# Patient Record
Sex: Female | Born: 1950 | Race: Black or African American | Hispanic: No | Smoking: Never smoker
Health system: Southern US, Community
[De-identification: ages and names within clinical notes are randomized; demographics above are authoritative.]

## PROBLEM LIST (undated history)

## (undated) DIAGNOSIS — I10 Essential (primary) hypertension: Secondary | ICD-10-CM

## (undated) DIAGNOSIS — G629 Polyneuropathy, unspecified: Secondary | ICD-10-CM

## (undated) DIAGNOSIS — M109 Gout, unspecified: Secondary | ICD-10-CM

## (undated) DIAGNOSIS — G43909 Migraine, unspecified, not intractable, without status migrainosus: Secondary | ICD-10-CM

## (undated) DIAGNOSIS — I519 Heart disease, unspecified: Secondary | ICD-10-CM

## (undated) HISTORY — DX: Heart disease, unspecified: I51.9

## (undated) HISTORY — PX: BRAIN SURGERY: SHX531

## (undated) HISTORY — PX: ABDOMINAL HYSTERECTOMY: SHX81

## (undated) HISTORY — DX: Essential (primary) hypertension: I10

## (undated) HISTORY — PX: COLONOSCOPY: SHX174

## (undated) HISTORY — DX: Gout, unspecified: M10.9

## (undated) HISTORY — DX: Polyneuropathy, unspecified: G62.9

## (undated) HISTORY — DX: Migraine, unspecified, not intractable, without status migrainosus: G43.909

---

## 1975-12-14 HISTORY — PX: OTHER SURGICAL HISTORY: SHX169

## 2022-03-24 ENCOUNTER — Other Ambulatory Visit: Payer: Self-pay

## 2022-03-24 ENCOUNTER — Emergency Department: Payer: Medicare Other

## 2022-03-24 ENCOUNTER — Encounter: Payer: Self-pay | Admitting: Emergency Medicine

## 2022-03-24 ENCOUNTER — Emergency Department
Admission: EM | Admit: 2022-03-24 | Discharge: 2022-03-24 | Disposition: A | Discharge status: Home / Self Care | Payer: Medicare Other | Attending: Emergency Medicine | Admitting: Emergency Medicine

## 2022-03-24 DIAGNOSIS — M79604 Pain in right leg: Secondary | ICD-10-CM | POA: Diagnosis not present

## 2022-03-24 DIAGNOSIS — R1010 Upper abdominal pain, unspecified: Secondary | ICD-10-CM | POA: Diagnosis not present

## 2022-03-24 DIAGNOSIS — W182XXA Fall in (into) shower or empty bathtub, initial encounter: Secondary | ICD-10-CM | POA: Insufficient documentation

## 2022-03-24 DIAGNOSIS — Z950 Presence of cardiac pacemaker: Secondary | ICD-10-CM | POA: Diagnosis not present

## 2022-03-24 DIAGNOSIS — S0083XA Contusion of other part of head, initial encounter: Secondary | ICD-10-CM | POA: Insufficient documentation

## 2022-03-24 DIAGNOSIS — R42 Dizziness and giddiness: Secondary | ICD-10-CM

## 2022-03-24 DIAGNOSIS — M25569 Pain in unspecified knee: Secondary | ICD-10-CM | POA: Insufficient documentation

## 2022-03-24 DIAGNOSIS — S0990XA Unspecified injury of head, initial encounter: Secondary | ICD-10-CM | POA: Diagnosis present

## 2022-03-24 LAB — URINALYSIS, ROUTINE W REFLEX MICROSCOPIC
Bacteria, UA: NONE SEEN
Bilirubin Urine: NEGATIVE
Glucose, UA: NEGATIVE mg/dL
Hgb urine dipstick: NEGATIVE
Ketones, ur: NEGATIVE mg/dL
Nitrite: NEGATIVE
Protein, ur: >=300 mg/dL — AB
Specific Gravity, Urine: 1.014 (ref 1.005–1.030)
pH: 6 (ref 5.0–8.0)

## 2022-03-24 LAB — BASIC METABOLIC PANEL
Anion gap: 9 (ref 5–15)
BUN: 23 mg/dL (ref 8–23)
CO2: 23 mmol/L (ref 22–32)
Calcium: 7.8 mg/dL — ABNORMAL LOW (ref 8.9–10.3)
Chloride: 105 mmol/L (ref 98–111)
Creatinine, Ser: 1.72 mg/dL — ABNORMAL HIGH (ref 0.44–1.00)
GFR, Estimated: 32 mL/min — ABNORMAL LOW (ref >60)
Glucose, Bld: 105 mg/dL — ABNORMAL HIGH (ref 70–99)
Potassium: 4.2 mmol/L (ref 3.5–5.1)
Sodium: 137 mmol/L (ref 135–145)

## 2022-03-24 LAB — CBC
HCT: 35.5 % — ABNORMAL LOW (ref 36.0–46.0)
Hemoglobin: 11.3 g/dL — ABNORMAL LOW (ref 12.0–15.0)
MCH: 30.1 pg (ref 26.0–34.0)
MCHC: 31.8 g/dL (ref 30.0–36.0)
MCV: 94.4 fL (ref 80.0–100.0)
Platelets: 203 10*3/uL (ref 150–400)
RBC: 3.76 MIL/uL — ABNORMAL LOW (ref 3.87–5.11)
RDW: 12.9 % (ref 11.5–15.5)
WBC: 9.3 10*3/uL (ref 4.0–10.5)
nRBC: 0 % (ref 0.0–0.2)

## 2022-03-24 LAB — TROPONIN I (HIGH SENSITIVITY)
Troponin I (High Sensitivity): 7 ng/L (ref <18)
Troponin I (High Sensitivity): 9 ng/L (ref <18)

## 2022-03-24 NOTE — ED Provider Notes (Signed)
? ?[facility] ?Provider Note ? ? ? Event Date/Time  ? First MD Initiated Contact with Patient 03/24/22 1548   ?  (approximate) ? ? ?History  ? ?Dizziness and Fall ? ? ?HPI ? ?Valerie Munoz is a 71 y.o. female who reports she took a shower this morning sat in the chair and then got up to get dressed.  She got dizzy and fell over hitting her knee.  She also hit her forehead.  She did not pass out.  She complains of some pain in the area of her forehead where there is a large bruise.  She reports she still dizzy now.  She is not spinning she is not lightheaded just a little off balance. ?  ? ? ?Physical Exam  ? ?Triage Vital Signs: ?ED Triage Vitals  ?Enc Vitals Group  ?   BP 03/24/22 1327 (!) 157/90  ?   Pulse Rate 03/24/22 1327 83  ?   Resp 03/24/22 1327 18  ?   Temp 03/24/22 1327 98 ?F (36.7 ?C)  ?   Temp Source 03/24/22 1327 Oral  ?   SpO2 03/24/22 1327 95 %  ?   Weight 03/24/22 1320 185 lb (83.9 kg)  ?   Height 03/24/22 1320 '5\' 4"'$  (1.626 m)  ?   Head Circumference --   ?   Peak Flow --   ?   Pain Score 03/24/22 1320 10  ?   Pain Loc --   ?   Pain Edu? --   ?   Excl. in [place]? --   ? ? ?Most recent vital signs: ?Vitals:  ? 03/24/22 1750 03/24/22 1938  ?BP: (!) 153/91 (!) 157/86  ?Pulse: 77 80  ?Resp: 16 16  ?Temp:    ?SpO2: 96% 99%  ? ? ? ?General: Awake, no distress.  ?Head normocephalic atraumatic except for a large bruise and swelling over the forehead ?Eyes pupils equal round extraocular movements intact. ?CV:  Good peripheral perfusion.  ?Resp:  Normal effort.  Lungs are clear ?Abd:  No distention.  Soft and nontender ?Extremities with no edema.  Patient has some tenderness of the upper outer quadrant of the patella but there is no bruising there.  Patient has good range of motion of the knee there is no effusion knee is stable patient can walk without difficulty ?Neuro exam cranial nerves II through XII are intact although visual fields were not checked finger-nose heel-to-shin and rapid  alternating movements and the hands are normal patient had some difficulty because of knee pain with the right leg on the left shin.  Romberg was negative with her eyes closed with her hands extended I pushed her and she did not fall over did not even become unsteady ? ? ?ED Results / Procedures / Treatments  ? ?Labs ?(all labs ordered are listed, but only abnormal results are displayed) ?Labs Reviewed  ?BASIC METABOLIC PANEL - Abnormal; Notable for the following components:  ?    Result Value  ? Glucose, Bld 105 (*)   ? Creatinine, Ser 1.72 (*)   ? Calcium 7.8 (*)   ? GFR, Estimated 32 (*)   ? All other components within normal limits  ?CBC - Abnormal; Notable for the following components:  ? RBC 3.76 (*)   ? Hemoglobin 11.3 (*)   ? HCT 35.5 (*)   ? All other components within normal limits  ?URINALYSIS, ROUTINE W REFLEX MICROSCOPIC - Abnormal; Notable for the following components:  ? Color,  Urine YELLOW (*)   ? APPearance HAZY (*)   ? Protein, ur >=300 (*)   ? Leukocytes,Ua MODERATE (*)   ? All other components within normal limits  ?CBG MONITORING, ED  ?TROPONIN I (HIGH SENSITIVITY)  ?TROPONIN I (HIGH SENSITIVITY)  ? ? ? ?EKG ? ? ? ? 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 ?CT of the head read by radiology films reviewed by me only show a hematoma over the forehead. ?CT of the neck does not show any acute disease.  There is a thyroid nodule but I did tell the patient to follow-up with her primary care doctor. ? ?PROCEDURES: ? ?Critical Care performed:  ? ?Procedures ? ? ?MEDICATIONS ORDERED IN ED: ?Medications - No data to display ? ? ?IMPRESSION / MDM / ASSESSMENT AND PLAN / ED COURSE  ?I reviewed the triage vital signs and the nursing notes. ?----------------------------------------- ?6:00 PM on 03/24/2022 ?----------------------------------------- ?Patient has a  [employer] pacemaker in place.  We do not have a [employer]'s pacemaker interrogator here apparently.  We have called the phone number on the patient's card 3-4 times and then we got a doctor who I spoke with who referred me to a cardiologist who I spoke with who referred me to our tech who I spoke with who [place] he will be here in an hour to interrogate the patient's pacemaker.  Patient is feeling well at this point.  Her knee is not really swollen.  It has full range of motion there is 1 little area of tenderness in the upper outer part of the patella but there its not bruised or swollen there. ?Electrolytes CBC urine etc. have all been normal.  Troponin is also negative.  I do not have any old labs to compare to. ?Pacemaker was interrogated by the [employer] technician who came in.  Everything was normal.  Patient's Romberg was normal.  Patient has no palmar drift.  Patient looks good and feels good is able to walk without difficulty.  I will let her go home.  I will have her follow-up with primary care and her cardiologist. ? ?The patient is on the cardiac monitor to evaluate for evidence of arrhythmia and/or significant heart rate changes none were seen. ? ?Patient's pacemaker defibrillator was interrogated by the tech who came and nothing was found the [employer] tech did leave a report. ? ?CT of the neck did show a thyroid nodule.  I will have the patient called in the morning to remind him to follow-up thyroid nodule.  Will need an outpatient ultrasound which primary care can schedule. ? ?FINAL CLINICAL IMPRESSION(S) / ED DIAGNOSES  ? ?Final diagnoses:  ?Dizziness  ? ? ? ?Rx / DC Orders  ? ?ED Discharge Orders   ? ? None  ? ?  ? ? ? ?Note:  This document was prepared using Dragon voice recognition software and may include unintentional dictation errors. ?  ?Nena Polio, MD ?03/25/22 0000 ? ?

## 2022-03-24 NOTE — ED Notes (Signed)
April with [employer] at bedside ?

## 2022-03-24 NOTE — Discharge Instructions (Signed)
Please be careful.  Get up slowly while you are holding onto something.  Everything we have done here today including checking on your pacer defibrillator has been normal.  I will let you go but I do want you to see your primary care doctor in the next couple days and your cardiologist also in the next couple days.  Please do not hesitate to return if you have any further symptoms. ?

## 2022-03-24 NOTE — ED Triage Notes (Addendum)
Pt via POV from home. Pt states after she got out of the shower she got dizzy and fell. Pt hit her forehead on the floor. Pt c/o headache. Denies any LOC. States she does not know if she if on any blood thinners. Pt has a large hematoma to R forehead. Pt is A&OX4 and NAD ?

## 2022-03-24 NOTE — ED Notes (Signed)
Pacemaker interrogated by [employer] ?

## 2022-03-24 NOTE — ED Notes (Signed)
EDP on phone with on call cardiologist from pacemaker company. They will call back. ?

## 2022-03-24 NOTE — ED Notes (Signed)
Pt has [employer] ICD/pacemaker. This ER does not have same type of pacemaker interrogator device. Tried to call company at [phone]. No answer, attempting to wait on hold for operator. ?

## 2022-03-24 NOTE — ED Notes (Signed)
Trying to call back ICD company again. On hold for on call physician.  ?

## 2022-03-25 ENCOUNTER — Telehealth: Payer: Self-pay | Admitting: Emergency Medicine

## 2022-03-25 NOTE — Telephone Encounter (Signed)
Called patient at request of dr Cinda Quest.  I had sent secure chat to pcp that is listed.  Patient says she has called that office, and that PA is gone.  They told her that it may be July before they can see her.  She has called the insurance co and is going to call to find another pcp.  I explained the incidental finding of nodule in thyroid and that she needs to have an ultrasound to determine what it is.  I told her that she should not wait that long and advised she should have within the next month.  I told her that if her new pcp could not see her within the next few weeks, she should call the insurance company to see which urgent or acute care she could go to to get the ultrasound ordered.  She agrees to do this.   ?

## 2022-03-26 ENCOUNTER — Ambulatory Visit: Payer: Self-pay

## 2022-03-26 NOTE — Telephone Encounter (Signed)
? ? ?  Chief Complaint: Seen in ED 03/24/22 with dizziness and fall. Has appointment next week. ?Symptoms: Headache ?Frequency: This week ?Pertinent Negatives: Patient denies  ?Disposition: '[]'$ ED /'[]'$ Urgent Care (no appt availability in office) / '[]'$ Appointment(In office/virtual)/ '[]'$  [facility]/ '[]'$ Home Care/ '[]'$ Refused Recommended Disposition /'[]'$ [facility]/ '[x]'$  Follow-up with PCP ?Additional Notes: Instructed to return to ED for worsening of symptoms.  ? ?Answer Assessment - Initial Assessment Questions ?1. DESCRIPTION: "Describe your dizziness." ?    Dizzy ?2. LIGHTHEADED: "Do you feel lightheaded?" (e.g., somewhat faint, woozy, weak upon standing) ?    Yes ?3. VERTIGO: "Do you feel like either you or the room is spinning or tilting?" (i.e. vertigo) ?    No ?4. SEVERITY: "How bad is it?"  "Do you feel like you are going to faint?" "Can you stand and walk?" ?  - MILD: Feels slightly dizzy, but walking normally. ?  - MODERATE: Feels unsteady when walking, but not falling; interferes with normal activities (e.g., school, work). ?  - SEVERE: Unable to walk without falling, or requires assistance to walk without falling; feels like passing out now.  ?    Mild-moderate ?5. ONSET:  "When did the dizziness begin?" ?    This week ?6. AGGRAVATING FACTORS: "Does anything make it worse?" (e.g., standing, change in head position) ?    Movement ?7. HEART RATE: "Can you tell me your heart rate?" "How many beats in 15 seconds?"  (Note: not all patients can do this)   ?    No ?8. CAUSE: "What do you think is causing the dizziness?" ?    Unsure ?9. RECURRENT SYMPTOM: "Have you had dizziness before?" If Yes, ask: "When was the last time?" "What happened that time?" ?    Yes ?10. OTHER SYMPTOMS: "Do you have any other symptoms?" (e.g., fever, chest pain, vomiting, diarrhea, bleeding) ?      Pain to forehead from her fall ?11. PREGNANCY: "Is there any chance you are pregnant?" "When was your last menstrual  period?" ?      No ? ?Protocols used: Dizziness - Lightheadedness-A-AH ? ?

## 2022-03-30 ENCOUNTER — Ambulatory Visit (INDEPENDENT_AMBULATORY_CARE_PROVIDER_SITE_OTHER): Payer: Medicare Other | Admitting: Internal Medicine

## 2022-03-30 ENCOUNTER — Encounter: Payer: Self-pay | Admitting: Internal Medicine

## 2022-03-30 VITALS — BP 139/82 | HR 84 | Ht 64.0 in | Wt 189.2 lb

## 2022-03-30 DIAGNOSIS — L989 Disorder of the skin and subcutaneous tissue, unspecified: Secondary | ICD-10-CM

## 2022-03-30 DIAGNOSIS — Z95810 Presence of automatic (implantable) cardiac defibrillator: Secondary | ICD-10-CM | POA: Insufficient documentation

## 2022-03-30 DIAGNOSIS — E6609 Other obesity due to excess calories: Secondary | ICD-10-CM | POA: Diagnosis not present

## 2022-03-30 DIAGNOSIS — G8929 Other chronic pain: Secondary | ICD-10-CM | POA: Diagnosis not present

## 2022-03-30 DIAGNOSIS — G629 Polyneuropathy, unspecified: Secondary | ICD-10-CM

## 2022-03-30 DIAGNOSIS — S060XAA Concussion with loss of consciousness status unknown, initial encounter: Secondary | ICD-10-CM | POA: Insufficient documentation

## 2022-03-30 DIAGNOSIS — E041 Nontoxic single thyroid nodule: Secondary | ICD-10-CM

## 2022-03-30 DIAGNOSIS — Z6832 Body mass index (BMI) 32.0-32.9, adult: Secondary | ICD-10-CM

## 2022-03-30 DIAGNOSIS — S060XAD Concussion with loss of consciousness status unknown, subsequent encounter: Secondary | ICD-10-CM

## 2022-03-30 NOTE — Assessment & Plan Note (Signed)
Patient will be referred to neurologist ?

## 2022-03-30 NOTE — Progress Notes (Signed)
? ?New Patient Office Visit ? ?Subjective:  ?Patient ID: Valerie Munoz, female    DOB: Nov 22, 1951  Age: 71 y.o. MRN: [id number] ? ?CC:  ?Chief Complaint  ?Patient presents with  ? New Patient (Initial Visit)  ? ? ?HPI ?Patient presents for head injury  , rt knee injury  and rt thyroid  nodule, h/o falli wk ago, pt walk with  walker ,pt has defib,no h/o ht attack, pt dont smoke or drink ,pt has h/o  chf ? ? ?Patient was seen in the hospital on 03/24/2022 ? ?With a history that after taking shower in the morning she sat in the chair got up and dressed.  At that time she became dizzy.  She fell over and she hit her knee.  She also hit her forehead.  She did not pass out completely.  Her blood pressure was okay in the emergency room.  Patient is known to have a defibrillator which has been started in [place].  Her kidney functions are borderline calcium was borderline low in the hospital.  Hemoglobin was 11.3.  CT scan revealed a hematoma of the forehead small thyroid nodule noted by the CT scan.  Patient pacemaker is [employer] ? ?Patient has pain in the both legs consistent with neuropathy.  She had a brain surgery done and then after that she was started on trileptal 150 mg p.o. daily, she probably had to see neurologist  sometime later  ?at the present time we will try to get her to a pain specialist to manage her pain with neuropathy ? ?Past Medical History:  ?Diagnosis Date  ? Heart disease   ? Hypertension   ? Migraine headache   ? Neuropathy   ? ? ? ?Current Outpatient Medications:  ?  carvedilol (COREG) 25 MG tablet, Take 25 mg by mouth 2 (two) times daily., Disp: , Rfl:  ?  DULoxetine (CYMBALTA) 30 MG capsule, Take 30 mg by mouth daily., Disp: , Rfl:  ?  folic acid (FOLVITE) 1 MG tablet, Take 1 mg by mouth daily., Disp: , Rfl:  ?  furosemide (LASIX) 20 MG tablet, Take 20 mg by mouth every other day., Disp: , Rfl:  ?  gabapentin (NEURONTIN) 300 MG capsule, Take 300 mg by mouth 3 (three) times daily., Disp: ,  Rfl:  ?  losartan (COZAAR) 50 MG tablet, Take 50 mg by mouth daily., Disp: , Rfl:  ?  multivitamin (RENA-VIT) TABS tablet, Take 1 tablet by mouth daily., Disp: , Rfl:  ?  OXcarbazepine (TRILEPTAL) 150 MG tablet, Take 150 mg by mouth daily., Disp: , Rfl:   ? ?Past Surgical History:  ?Procedure Laterality Date  ? ABDOMINAL HYSTERECTOMY    ? eye tumor removal  1977  ? ? ?History reviewed. No pertinent family history. ? ?Social History  ? ?Socioeconomic History  ? Marital status: Widowed  ?  Spouse name: Not on file  ? Number of children: Not on file  ? Years of education: Not on file  ? Highest education level: Not on file  ?Occupational History  ? Not on file  ?Tobacco Use  ? Smoking status: Never  ? Smokeless tobacco: Never  ?Substance and Sexual Activity  ? Alcohol use: Not Currently  ? Drug use: Never  ? Sexual activity: Not Currently  ?Other Topics Concern  ? Not on file  ?Social History Narrative  ? Not on file  ? ?Social Determinants of Health  ? ?Financial Resource Strain: Not on file  ?Food Insecurity: Not  on file  ?Transportation Needs: Not on file  ?Physical Activity: Not on file  ?Stress: Not on file  ?Social Connections: Not on file  ?Intimate Partner Violence: Not on file  ? ? ?ROS ?Review of Systems  ?Constitutional:  Positive for fatigue. Negative for chills and fever.  ?HENT:  Positive for postnasal drip and sneezing.   ?Eyes:  Negative for pain.  ?Respiratory:  Negative for cough, choking, chest tightness and wheezing.   ?Cardiovascular:  Positive for leg swelling. Negative for chest pain.  ?Genitourinary:  Negative for dysuria.  ?Musculoskeletal:  Positive for arthralgias and back pain.  ?Neurological:  Positive for dizziness, weakness and light-headedness.  ?Psychiatric/Behavioral:  Negative for behavioral problems.   ? ?Objective:  ? ?Today's Vitals: BP 139/82   Pulse 84   Ht '5\' 4"'$  (1.626 m)   Wt 189 lb 3.2 oz (85.8 kg)   BMI 32.48 kg/m?  ? ?Physical Exam ?Vitals reviewed.  ?Constitutional:   ?    Appearance: She is obese. She is ill-appearing.  ?HENT:  ?   Head: Normocephalic.  ? ?   Comments: Bruise forehead, blood below both eyes ?Neurological:  ?   Mental Status: She is alert.  ? ? ?Assessment & Plan:  ? ?Problem List Items Addressed This Visit   ? ?  ? Endocrine  ? Thyroid cyst  ?  Patient has nodule or thyroid cyst which was seen on the CT scan ? patient will be referred to the endocrinologist ? ?  ?  ? Relevant Medications  ? carvedilol (COREG) 25 MG tablet  ?  ? Nervous and Auditory  ? Concussion with unknown loss of consciousness status - Primary  ?  Patient can walk independently   ?no focal neurological signs  present  ?she was advised to report any dizziness or passing out spell. ? ?  ?  ? Neuropathy  ?  Patient will be referred to neurologist ? ?  ?  ?  ? Other  ? AICD (automatic cardioverter/defibrillator) present  ?  Patient be referred to EP specialist ? ?  ?  ? Class 1 obesity due to excess calories without serious comorbidity with body mass index (BMI) of 32.0 to 32.9 in adult  ?  - I encouraged the patient to lose weight.  ?- I educated them on making healthy dietary choices including eating more fruits and vegetables and less fried foods. ?- I encouraged the patient to exercise more, and educated on the benefits of exercise including weight loss, diabetes prevention, and hypertension prevention.  ? Dietary counseling with a registered dietician ? Referral to a weight management support group (e.g. Weight Watchers, Overeaters Anonymous) ?? If your BMI is greater than 29 or you have gained more than 15 pounds you should work on weight loss. ?? Attend a healthy cooking class  ? ?  ?  ? ? ?Outpatient Encounter Medications as of 03/30/2022  ?Medication Sig  ? carvedilol (COREG) 25 MG tablet Take 25 mg by mouth 2 (two) times daily.  ? DULoxetine (CYMBALTA) 30 MG capsule Take 30 mg by mouth daily.  ? folic acid (FOLVITE) 1 MG tablet Take 1 mg by mouth daily.  ? furosemide (LASIX) 20 MG tablet  Take 20 mg by mouth every other day.  ? gabapentin (NEURONTIN) 300 MG capsule Take 300 mg by mouth 3 (three) times daily.  ? losartan (COZAAR) 50 MG tablet Take 50 mg by mouth daily.  ? multivitamin (RENA-VIT) TABS tablet Take 1 tablet by  mouth daily.  ? OXcarbazepine (TRILEPTAL) 150 MG tablet Take 150 mg by mouth daily.  ? [DISCONTINUED] enalapril (VASOTEC) 20 MG tablet Take 20 mg by mouth daily.  ? ?No facility-administered encounter medications on file as of 03/30/2022.  ? ? ?Follow-up: No follow-ups on file.  ? ?Cletis Athens, MD ?

## 2022-03-30 NOTE — Assessment & Plan Note (Addendum)
Patient has nodule or thyroid cyst which was seen on the CT scan ? patient will be referred to the endocrinologist ?

## 2022-03-30 NOTE — Assessment & Plan Note (Signed)

## 2022-03-30 NOTE — Assessment & Plan Note (Signed)
Patient be referred to EP specialist ?

## 2022-03-30 NOTE — Assessment & Plan Note (Addendum)
Patient can walk independently   ?no focal neurological signs  present  ?she was advised to report any dizziness or passing out spell. ?

## 2022-04-05 ENCOUNTER — Encounter: Payer: Self-pay | Admitting: Internal Medicine

## 2022-04-05 NOTE — Addendum Note (Signed)
Addended by: Alois Cliche on: 04/05/2022 03:07 PM ? ? Modules accepted: Orders ? ?

## 2022-04-13 ENCOUNTER — Ambulatory Visit: Payer: Medicare Other | Admitting: Internal Medicine

## 2022-04-27 ENCOUNTER — Encounter: Payer: Self-pay | Admitting: Internal Medicine

## 2022-04-27 ENCOUNTER — Ambulatory Visit (INDEPENDENT_AMBULATORY_CARE_PROVIDER_SITE_OTHER): Payer: Medicare Other | Admitting: Internal Medicine

## 2022-04-27 VITALS — BP 140/83 | HR 66 | Ht 64.0 in | Wt 191.4 lb

## 2022-04-27 DIAGNOSIS — L2081 Atopic neurodermatitis: Secondary | ICD-10-CM | POA: Diagnosis not present

## 2022-04-27 DIAGNOSIS — G629 Polyneuropathy, unspecified: Secondary | ICD-10-CM

## 2022-04-27 DIAGNOSIS — Z95810 Presence of automatic (implantable) cardiac defibrillator: Secondary | ICD-10-CM | POA: Diagnosis not present

## 2022-04-27 DIAGNOSIS — E6609 Other obesity due to excess calories: Secondary | ICD-10-CM

## 2022-04-27 DIAGNOSIS — Z6832 Body mass index (BMI) 32.0-32.9, adult: Secondary | ICD-10-CM

## 2022-04-27 DIAGNOSIS — S060XAD Concussion with loss of consciousness status unknown, subsequent encounter: Secondary | ICD-10-CM

## 2022-04-27 MED ORDER — CETIRIZINE HCL 10 MG PO TABS: 10.0000 mg | ORAL_TABLET | Freq: Every day | ORAL | 11 refills | Status: DC

## 2022-04-27 NOTE — Assessment & Plan Note (Signed)
We will try to arrange pacemaker follow-up ?

## 2022-04-27 NOTE — Assessment & Plan Note (Signed)
Neuropathy is a chronic problem she takes gabapentin for that ?

## 2022-04-27 NOTE — Assessment & Plan Note (Signed)

## 2022-04-27 NOTE — Progress Notes (Signed)
? ?Established Patient Office Visit ? ?Subjective:  ?Patient ID: Valerie Munoz, female    DOB: 12/23/1950  Age: 71 y.o. MRN: [id number] ? ?CC:  ?Chief Complaint  ?Patient presents with  ? Follow-up  ?  Patient has no complains today. Patient does currently have a rash but has an appointment with dermatology.  ? ? ?HPI ? ?Valerie Munoz presents for allergy  and congestion in sinuses ? ?Past Medical History:  ?Diagnosis Date  ? Heart disease   ? Hypertension   ? Migraine headache   ? Neuropathy   ? ? ?Past Surgical History:  ?Procedure Laterality Date  ? ABDOMINAL HYSTERECTOMY    ? eye tumor removal  1977  ? ? ?History reviewed. No pertinent family history. ? ?Social History  ? ?Socioeconomic History  ? Marital status: Widowed  ?  Spouse name: Not on file  ? Number of children: Not on file  ? Years of education: Not on file  ? Highest education level: Not on file  ?Occupational History  ? Not on file  ?Tobacco Use  ? Smoking status: Never  ? Smokeless tobacco: Never  ?Substance and Sexual Activity  ? Alcohol use: Not Currently  ? Drug use: Never  ? Sexual activity: Not Currently  ?Other Topics Concern  ? Not on file  ?Social History Narrative  ? Not on file  ? ?Social Determinants of Health  ? ?Financial Resource Strain: Not on file  ?Food Insecurity: Not on file  ?Transportation Needs: Not on file  ?Physical Activity: Not on file  ?Stress: Not on file  ?Social Connections: Not on file  ?Intimate Partner Violence: Not on file  ? ? ? ?Current Outpatient Medications:  ?  carvedilol (COREG) 25 MG tablet, Take 25 mg by mouth 2 (two) times daily., Disp: , Rfl:  ?  DULoxetine (CYMBALTA) 30 MG capsule, Take 30 mg by mouth daily., Disp: , Rfl:  ?  folic acid (FOLVITE) 1 MG tablet, Take 1 mg by mouth daily., Disp: , Rfl:  ?  furosemide (LASIX) 20 MG tablet, Take 20 mg by mouth every other day., Disp: , Rfl:  ?  gabapentin (NEURONTIN) 300 MG capsule, Take 300 mg by mouth 3 (three) times daily., Disp: , Rfl:  ?  losartan  (COZAAR) 50 MG tablet, Take 50 mg by mouth daily., Disp: , Rfl:  ?  multivitamin (RENA-VIT) TABS tablet, Take 1 tablet by mouth daily., Disp: , Rfl:  ?  OXcarbazepine (TRILEPTAL) 150 MG tablet, Take 150 mg by mouth daily., Disp: , Rfl:   ? ?No Known Allergies ? ?ROS ?Review of Systems  ?Constitutional: Negative.   ?HENT: Negative.  Negative for ear discharge and ear pain.   ?Eyes: Negative.   ?Respiratory: Negative.    ?Cardiovascular: Negative.  Negative for chest pain, palpitations and leg swelling.  ?Gastrointestinal: Negative.  Negative for constipation and nausea.  ?Endocrine: Negative.   ?Genitourinary: Negative.   ?Musculoskeletal: Negative.   ?Skin:  Positive for color change and rash.  ?Allergic/Immunologic: Positive for environmental allergies.  ?Neurological: Negative.   ?Hematological: Negative.   ?Psychiatric/Behavioral: Negative.    ?All other systems reviewed and are negative. ? ?  ?Objective:  ?  ?Physical Exam ?Vitals reviewed.  ?Constitutional:   ?   Appearance: Normal appearance.  ?HENT:  ?   Mouth/Throat:  ?   Mouth: Mucous membranes are moist.  ?Eyes:  ?   Pupils: Pupils are equal, round, and reactive to light.  ?Neck:  ?   Vascular: No  carotid bruit.  ?Cardiovascular:  ?   Rate and Rhythm: Normal rate and regular rhythm.  ?   Pulses: Normal pulses.  ?   Heart sounds: Normal heart sounds.  ?Pulmonary:  ?   Effort: Pulmonary effort is normal.  ?   Breath sounds: Normal breath sounds.  ?Abdominal:  ?   General: Bowel sounds are normal.  ?   Palpations: Abdomen is soft. There is no hepatomegaly, splenomegaly or mass.  ?   Tenderness: There is no abdominal tenderness.  ?   Hernia: No hernia is present.  ?Musculoskeletal:     ?   General: No tenderness.  ?   Cervical back: Neck supple.  ?   Right lower leg: No edema.  ?   Left lower leg: No edema.  ?Skin: ?   Findings: No rash.  ?   Comments: Dermatitis of feet  ?Neurological:  ?   Mental Status: She is alert and oriented to person, place, and time.   ?   Motor: No weakness.  ?Psychiatric:     ?   Mood and Affect: Mood and affect normal.     ?   Behavior: Behavior normal.  ? ? ?BP 140/83   Pulse 66   Ht '5\' 4"'$  (1.626 m)   Wt 191 lb 6.4 oz (86.8 kg)   BMI 32.85 kg/m?  ?Wt Readings from Last 3 Encounters:  ?04/27/22 191 lb 6.4 oz (86.8 kg)  ?03/30/22 189 lb 3.2 oz (85.8 kg)  ?03/24/22 185 lb (83.9 kg)  ? ? ? ?Health Maintenance Due  ?Topic Date Due  ? COVID-19 Vaccine (1) Never done  ? Hepatitis C Screening  Never done  ? TETANUS/TDAP  Never done  ? COLONOSCOPY (Pts 45-75yr Insurance coverage will need to be confirmed)  Never done  ? MAMMOGRAM  Never done  ? Zoster Vaccines- Shingrix (1 of 2) Never done  ? Pneumonia Vaccine 71 Years old (1 - PCV) Never done  ? DEXA SCAN  Never done  ? ? ?There are no preventive care reminders to display for this patient. ? ?No results found for: TSH ?Lab Results  ?Component Value Date  ? WBC 9.3 03/24/2022  ? HGB 11.3 (L) 03/24/2022  ? HCT 35.5 (L) 03/24/2022  ? MCV 94.4 03/24/2022  ? PLT 203 03/24/2022  ? ?Lab Results  ?Component Value Date  ? NA 137 03/24/2022  ? K 4.2 03/24/2022  ? CO2 23 03/24/2022  ? GLUCOSE 105 (H) 03/24/2022  ? BUN 23 03/24/2022  ? CREATININE 1.72 (H) 03/24/2022  ? CALCIUM 7.8 (L) 03/24/2022  ? ANIONGAP 9 03/24/2022  ? ?No results found for: CHOL ?No results found for: HDL ?No results found for: LTallmadge?No results found for: TRIG ?No results found for: CHOLHDL ?No results found for: HGBA1C ? ?  ?Assessment & Plan:  ? ?Problem List Items Addressed This Visit   ? ?  ? Nervous and Auditory  ? Concussion with unknown loss of consciousness status - Primary  ?  Patient doing well she does not have any headache does not have any follow-up passing out spell.  Gait is steady ? ?  ?  ? Neuropathy  ?  Neuropathy is a chronic problem she takes gabapentin for that ? ?  ?  ?  ? Musculoskeletal and Integument  ? Atopic neurodermatitis  ?  Patient has either neurodermatitis or atopic dermatitis of the both feet.  We  will send her to the skin specialist ? ?  ?  ?  ?  Other  ? AICD (automatic cardioverter/defibrillator) present  ?  We will try to arrange pacemaker follow-up ? ?  ?  ? Class 1 obesity due to excess calories without serious comorbidity with body mass index (BMI) of 32.0 to 32.9 in adult  ?  - I encouraged the patient to lose weight.  ?- I educated them on making healthy dietary choices including eating more fruits and vegetables and less fried foods. ?- I encouraged the patient to exercise more, and educated on the benefits of exercise including weight loss, diabetes prevention, and hypertension prevention.  ? Dietary counseling with a registered dietician ? Referral to a weight management support group (e.g. Weight Watchers, Overeaters Anonymous) ?? If your BMI is greater than 29 or you have gained more than 15 pounds you should work on weight loss. ?? Attend a healthy cooking class  ? ?  ?  ?Hypercholesterolemia ? ?I advised the patient to follow Mediterranean diet ?This diet is rich in fruits vegetables and whole grain, and ?This diet is also rich in fish and lean meat ?Patient should also eat a handful of almonds or walnuts daily ?Recent heart study indicated that average follow-up on this kind of diet reduces the cardiovascular mortality by 50 to 70%== ? ?No orders of the defined types were placed in this encounter. ? ? ?Follow-up: No follow-ups on file.  ? ? ?Cletis Athens, MD ?

## 2022-04-27 NOTE — Assessment & Plan Note (Signed)
Patient has either neurodermatitis or atopic dermatitis of the both feet.  We will send her to the skin specialist ?

## 2022-04-27 NOTE — Assessment & Plan Note (Signed)
Patient doing well she does not have any headache does not have any follow-up passing out spell.  Gait is steady ?

## 2022-04-28 NOTE — Addendum Note (Signed)
Addended by: Alois Cliche on: 04/28/2022 09:36 AM ? ? Modules accepted: Orders ? ?

## 2022-04-30 ENCOUNTER — Ambulatory Visit (INDEPENDENT_AMBULATORY_CARE_PROVIDER_SITE_OTHER): Payer: Medicare Other

## 2022-04-30 DIAGNOSIS — Z0000 Encounter for general adult medical examination without abnormal findings: Secondary | ICD-10-CM | POA: Diagnosis not present

## 2022-04-30 MED ORDER — WALL GRAB BAR MISC: 2.0000 | Freq: Every day | 0 refills | Status: DC | PRN

## 2022-04-30 MED ORDER — ADJUST BATH/SHOWER SEAT MISC
1.0000 | Freq: Every day | 0 refills | Status: AC | PRN
Start: 2022-04-30 — End: ?

## 2022-04-30 NOTE — Progress Notes (Addendum)
Subjective:   Valerie Munoz is a 71 y.o. female who presents for Medicare Annual (Subsequent) preventive examination. I discussed the limitations of evaluation and management by telemedicine and the availability of in person appointments. The patient expressed understanding and agreed to proceed.   Visit performed by audio   Patient location: Home  Provider location: Home  Review of Systems    N/A Cardiac Risk Factors include: none     Objective:    Today's Vitals   04/30/22 1251  PainSc: 7    There is no height or weight on file to calculate BMI.     04/30/2022    1:06 PM 03/24/2022    1:23 PM  Advanced Directives  Does Patient Have a Medical Advance Directive? No No    Current Medications (verified) Outpatient Encounter Medications as of 04/30/2022  Medication Sig   carvedilol (COREG) 25 MG tablet Take 25 mg by mouth 2 (two) times daily.   cetirizine (ZYRTEC) 10 MG tablet Take 1 tablet (10 mg total) by mouth daily.   DULoxetine (CYMBALTA) 30 MG capsule Take 30 mg by mouth daily.   folic acid (FOLVITE) 1 MG tablet Take 1 mg by mouth daily.   furosemide (LASIX) 20 MG tablet Take 20 mg by mouth every other day.   gabapentin (NEURONTIN) 300 MG capsule Take 300 mg by mouth 3 (three) times daily.   losartan (COZAAR) 50 MG tablet Take 50 mg by mouth daily.   multivitamin (RENA-VIT) TABS tablet Take 1 tablet by mouth daily.   OXcarbazepine (TRILEPTAL) 150 MG tablet Take 150 mg by mouth daily.   No facility-administered encounter medications on file as of 04/30/2022.    Allergies (verified) Patient has no known allergies.   History: Past Medical History:  Diagnosis Date   Heart disease    Hypertension    Migraine headache    Neuropathy    Past Surgical History:  Procedure Laterality Date   ABDOMINAL HYSTERECTOMY     eye tumor removal  1977   No family history on file. Social History   Socioeconomic History   Marital status: Widowed    Spouse name: Not on  file   Number of children: Not on file   Years of education: Not on file   Highest education level: Associate degree: academic program  Occupational History   Not on file  Tobacco Use   Smoking status: Never   Smokeless tobacco: Never  Substance and Sexual Activity   Alcohol use: Not Currently   Drug use: Never   Sexual activity: Not Currently  Other Topics Concern   Not on file  Social History Narrative   Not on file   Social Determinants of Health   Financial Resource Strain: Low Risk    Difficulty of Paying Living Expenses: Not very hard  Food Insecurity: Food Insecurity Present   Worried About Running Out of Food in the Last Year: Sometimes true   Ran Out of Food in the Last Year: Sometimes true  Transportation Needs: No Transportation Needs   Lack of Transportation (Medical): No   Lack of Transportation (Non-Medical): No  Physical Activity: Inactive   Days of Exercise per Week: 0 days   Minutes of Exercise per Session: 0 min  Stress: No Stress Concern Present   Feeling of Stress : Not at all  Social Connections: Unknown   Frequency of Communication with Friends and Family: Patient refused   Frequency of Social Gatherings with Friends and Family: Patient refused  Attends Religious Services: Patient refused   Active Member of Clubs or Organizations: Patient refused   Attends Archivist Meetings: Patient refused   Marital Status: Widowed    Tobacco Counseling Counseling given: Not Answered   Clinical Intake:  Pre-visit preparation completed: Yes  Pain : 0-10 Pain Score: 7  Pain Type: Neuropathic pain Pain Descriptors / Indicators: Pins and needles, Burning, Tingling, Numbness Pain Onset: More than a month ago Pain Frequency: Intermittent     Diabetes: No  How often do you need to have someone help you when you read instructions, pamphlets, or other written materials from your doctor or pharmacy?: 1 - Never What is the last grade level you  completed in school?: Associates  Diabetic?No  Interpreter Needed?: No  Information entered by :: Anson [place] CMA   Activities of Daily Living    04/30/2022    1:06 PM  In your present state of health, do you have any difficulty performing the following activities:  Hearing? 0  Vision? 0  Difficulty concentrating or making decisions? 0  Walking or climbing stairs? 0  Dressing or bathing? 0  Doing errands, shopping? 0  Preparing Food and eating ? N  Using the Toilet? N  In the past six months, have you accidently leaked urine? N  Do you have problems with loss of bowel control? N  Managing your Medications? N  Managing your Finances? N  Housekeeping or managing your Housekeeping? N    Patient Care Team: Cletis Athens, MD as PCP - General (Internal Medicine)  Indicate any recent Medical Services you may have received from other than [facility] providers in the past year (date may be approximate).     Assessment:   This is a routine wellness examination for Valerie Munoz.  Hearing/Vision screen No results found.  Dietary issues and exercise activities discussed: Current Exercise Habits: The patient does not participate in regular exercise at present, Exercise limited by: neurologic condition(s) (neuropathy)   Goals Addressed   None    Depression Screen    04/30/2022   12:59 PM 03/30/2022    2:51 PM  PHQ 2/9 Scores  PHQ - 2 Score 0 0    Fall Risk    04/30/2022    1:07 PM 03/30/2022    2:50 PM  [place] in the past year? 1 1  Number falls in past yr: 1 1  Injury with Fall? 1 1  Risk for fall due to : History of fall(s) History of fall(s)  Follow up Falls evaluation completed Falls evaluation completed    [place]:  Any stairs in or around the home? Yes  If so, are there any without handrails? No  Home free of loose throw rugs in walkways, pet beds, electrical cords, etc? Yes  Adequate lighting in your home to reduce  risk of falls? Yes   ASSISTIVE DEVICES UTILIZED TO PREVENT FALLS:  Life alert? No  Use of a cane, walker or w/c? Yes  Grab bars in the bathroom? No  Shower chair or bench in shower? No  Elevated toilet seat or a handicapped toilet? No   TIMED UP AND GO:  Was the test performed? No .  Length of time to ambulate 10 feet: 0 sec.     Cognitive Function:        04/30/2022    1:07 PM  6CIT Screen  What Year? 0 points  What month? 0 points  What time?  0 points  Count back from 20 0 points  Months in reverse 0 points  Repeat phrase 0 points  Total Score 0 points    Immunizations  There is no immunization history on file for this patient.  TDAP status: Up to date  Flu Vaccine status: Up to date  Pneumococcal vaccine status: Up to date  Covid-19 vaccine status: Information provided on how to obtain vaccines.   Qualifies for Shingles Vaccine? Yes   Zostavax completed No   Shingrix Completed?: No.    Education has been provided regarding the importance of this vaccine. Patient has been advised to call insurance company to determine out of pocket expense if they have not yet received this vaccine. Advised may also receive vaccine at local pharmacy or Health Dept. Verbalized acceptance and understanding.  Screening Tests Health Maintenance  Topic Date Due   COVID-19 Vaccine (1) Never done   Hepatitis C Screening  Never done   TETANUS/TDAP  Never done   COLONOSCOPY (Pts 45-69yr Insurance coverage will need to be confirmed)  Never done   MAMMOGRAM  Never done   Zoster Vaccines- Shingrix (1 of 2) Never done   Pneumonia Vaccine 71 Years old (1 - PCV) Never done   DEXA SCAN  Never done   INFLUENZA VACCINE  07/13/2022   HPV VACCINES  Aged Out    Health Maintenance  Health Maintenance Due  Topic Date Due   COVID-19 Vaccine (1) Never done   Hepatitis C Screening  Never done   TETANUS/TDAP  Never done   COLONOSCOPY (Pts 45-443yrInsurance coverage will need to be  confirmed)  Never done   MAMMOGRAM  Never done   Zoster Vaccines- Shingrix (1 of 2) Never done   Pneumonia Vaccine 6562Years old (1 - PCV) Never done   DEXA SCAN  Never done    Colorectal cancer screening: Type of screening: Colonoscopy. Completed unsure. Repeat every 10 years  Mammogram status: Completed unsure. Repeat every year    Lung Cancer Screening: (Low Dose CT Chest recommended if Age 71-80ears, 30 pack-year currently smoking OR have quit w/in 15years.) does not qualify.   Lung Cancer Screening Referral: No  Additional Screening:  Hepatitis C Screening: does qualify; Completed No  Vision Screening: Recommended annual ophthalmology exams for early detection of glaucoma and other disorders of the eye. Is the patient up to date with their annual eye exam?  No  Who is the provider or what is the name of the office in which the patient attends annual eye exams? N/A If pt is not established with a provider, would they like to be referred to a provider to establish care? No .   Dental Screening: Recommended annual dental exams for proper oral hygiene  Community Resource Referral / Chronic Care Management: CRR required this visit?  No   CCM required this visit?  No      Plan:     I have personally reviewed and noted the following in the patient's chart:   Medical and social history Use of alcohol, tobacco or illicit drugs  Current medications and supplements including opioid prescriptions.  Functional ability and status Nutritional status Physical activity Advanced directives List of other physicians Hospitalizations, surgeries, and ER visits in previous 12 months Vitals Screenings to include cognitive, depression, and falls Referrals and appointments  In addition, I have reviewed and discussed with patient certain preventive protocols, quality metrics, and best practice recommendations. A written personalized care plan for preventive services as  well as  general preventive health recommendations were provided to patient.     Renato Gails, [place]   04/30/2022   Nurse Notes: Patient has multiple care gaps, patient states she has had most of these completed. Ms. Syfert will sign a release of records from her prior PCP in [place] and we will update the records at that time. Ms. Soliz would like to have a shower chair and grab bars for bathroom. Patient was informed these will be faxed to the pharmacy.   I have reviewed and agreed with the above documentation.  [name], NP

## 2022-05-20 ENCOUNTER — Encounter: Payer: Self-pay | Admitting: Nurse Practitioner

## 2022-05-20 ENCOUNTER — Ambulatory Visit (INDEPENDENT_AMBULATORY_CARE_PROVIDER_SITE_OTHER): Payer: Medicare Other | Admitting: Nurse Practitioner

## 2022-05-20 VITALS — Ht 64.0 in

## 2022-05-20 DIAGNOSIS — G8929 Other chronic pain: Secondary | ICD-10-CM | POA: Diagnosis not present

## 2022-05-20 DIAGNOSIS — L2081 Atopic neurodermatitis: Secondary | ICD-10-CM | POA: Diagnosis not present

## 2022-05-20 MED ORDER — CETIRIZINE HCL 10 MG PO TABS: 10.0000 mg | ORAL_TABLET | Freq: Every day | ORAL | 1 refills | Status: DC

## 2022-05-20 MED ORDER — WALL GRAB BAR MISC: 2.0000 | Freq: Every day | 0 refills | Status: DC | PRN

## 2022-05-20 MED ORDER — WALL GRAB BAR MISC: 1.0000 | 0 refills | Status: AC

## 2022-05-20 MED ORDER — TRIAMCINOLONE ACETONIDE 0.1 % EX CREA: 1.0000 "application " | TOPICAL_CREAM | Freq: Two times a day (BID) | CUTANEOUS | 0 refills | Status: DC

## 2022-05-20 NOTE — Progress Notes (Signed)
Established Patient Office Visit  Subjective:  Patient ID: Valerie Munoz, female    DOB: 01-06-1951  Age: 71 y.o. MRN: [id number]  CC:  Chief Complaint  Patient presents with   Follow-up    HPI  Valerie Munoz presents for follow up on the rash. Patient moved from new [place] in march 2023.  Past Medical History:  Diagnosis Date   Heart disease    Hypertension    Migraine headache    Neuropathy     Past Surgical History:  Procedure Laterality Date   ABDOMINAL HYSTERECTOMY     eye tumor removal  1977    History reviewed. No pertinent family history.  Social History   Socioeconomic History   Marital status: Widowed    Spouse name: Not on file   Number of children: Not on file   Years of education: 14   Highest education level: Associate degree: academic program  Occupational History   Not on file  Tobacco Use   Smoking status: Never   Smokeless tobacco: Never  Substance and Sexual Activity   Alcohol use: Not Currently   Drug use: Never   Sexual activity: Not Currently  Other Topics Concern   Not on file  Social History Narrative   Not on file   Social Determinants of Health   Financial Resource Strain: Low Risk  (04/30/2022)   Overall Financial Resource Strain (CARDIA)    Difficulty of Paying Living Expenses: Not very hard  Food Insecurity: Food Insecurity Present (04/30/2022)   Hunger Vital Sign    Worried About Running Out of Food in the Last Year: Sometimes true    Ran Out of Food in the Last Year: Sometimes true  Transportation Needs: No Transportation Needs (04/30/2022)   PRAPARE - Hydrologist (Medical): No    Lack of Transportation (Non-Medical): No  Physical Activity: Inactive (04/30/2022)   Exercise Vital Sign    Days of Exercise per Week: 0 days    Minutes of Exercise per Session: 0 min  Stress: No Stress Concern Present (04/30/2022)   [place]     Feeling of Stress : Not at all  Social Connections: Unknown (04/30/2022)   Social Connection and Isolation Panel [NHANES]    Frequency of Communication with Friends and Family: Patient refused    Frequency of Social Gatherings with Friends and Family: Patient refused    Attends Religious Services: Patient refused    Active Member of Clubs or Organizations: Patient refused    Attends Archivist Meetings: Patient refused    Marital Status: Widowed  Intimate Partner Violence: Not At Risk (04/30/2022)   Humiliation, Afraid, Rape, and Kick questionnaire    Fear of Current or Ex-Partner: No    Emotionally Abused: No    Physically Abused: No    Sexually Abused: No     Current Outpatient Medications:    carvedilol (COREG) 25 MG tablet, Take 25 mg by mouth 2 (two) times daily., Disp: , Rfl:    DULoxetine (CYMBALTA) 30 MG capsule, Take 30 mg by mouth daily., Disp: , Rfl:    folic acid (FOLVITE) 1 MG tablet, Take 1 mg by mouth daily., Disp: , Rfl:    furosemide (LASIX) 20 MG tablet, Take 20 mg by mouth every other day., Disp: , Rfl:    gabapentin (NEURONTIN) 300 MG capsule, Take 300 mg by mouth 3 (three) times daily., Disp: , Rfl:    losartan (  COZAAR) 50 MG tablet, Take 50 mg by mouth daily., Disp: , Rfl:    Misc. Devices (ADJUST BATH/SHOWER SEAT) MISC, 1 each by Does not apply route daily as needed., Disp: 1 each, Rfl: 0   multivitamin (RENA-VIT) TABS tablet, Take 1 tablet by mouth daily., Disp: , Rfl:    OXcarbazepine (TRILEPTAL) 150 MG tablet, Take 150 mg by mouth daily., Disp: , Rfl:    triamcinolone cream (KENALOG) 0.1 %, Apply 1 application. topically 2 (two) times daily., Disp: 30 g, Rfl: 0   cetirizine (ZYRTEC) 10 MG tablet, Take 1 tablet (10 mg total) by mouth daily., Disp: 60 tablet, Rfl: 1   Misc. Devices (WALL GRAB BAR) MISC, 1 each by Does not apply route as directed., Disp: 1 each, Rfl: 0   No Known Allergies  ROS Review of Systems  Constitutional:  Negative for  activity change, appetite change and diaphoresis.  HENT:  Positive for rhinorrhea. Negative for ear discharge and ear pain.   Eyes:  Negative for discharge and itching.  Respiratory:  Negative for apnea, choking and shortness of breath.   Cardiovascular:  Negative for chest pain, palpitations and leg swelling.  Gastrointestinal: Negative.  Negative for constipation and nausea.  Endocrine: Negative.   Genitourinary:  Negative for difficulty urinating, flank pain and hematuria.  Musculoskeletal:  Negative for arthralgias, back pain and myalgias.  Skin:  Positive for color change and rash.  Allergic/Immunologic: Positive for environmental allergies.  Neurological:  Negative for dizziness, facial asymmetry and headaches.  Psychiatric/Behavioral:  Negative for agitation, behavioral problems and confusion.       Objective:    Physical Exam Vitals reviewed.  Constitutional:      Appearance: Normal appearance. She is obese.  HENT:     Head: Normocephalic and atraumatic.     Right Ear: Tympanic membrane normal.     Left Ear: Tympanic membrane normal.     Nose: Nose normal.     Mouth/Throat:     Mouth: Mucous membranes are moist.  Eyes:     Extraocular Movements: Extraocular movements intact.     Pupils: Pupils are equal, round, and reactive to light.  Neck:     Vascular: No carotid bruit.  Cardiovascular:     Rate and Rhythm: Normal rate and regular rhythm.     Pulses: Normal pulses.     Heart sounds: Normal heart sounds.  Pulmonary:     Effort: Pulmonary effort is normal.     Breath sounds: Normal breath sounds.  Abdominal:     General: Bowel sounds are normal.     Palpations: Abdomen is soft. There is no hepatomegaly or splenomegaly.     Tenderness: There is no abdominal tenderness.     Hernia: No hernia is present.  Musculoskeletal:        General: No tenderness.     Cervical back: Normal range of motion.     Right lower leg: No edema.     Left lower leg: No edema.   Skin:    Findings: Rash present.  Neurological:     General: No focal deficit present.     Mental Status: She is alert and oriented to person, place, and time. Mental status is at baseline.     Motor: No weakness.  Psychiatric:        Mood and Affect: Mood and affect normal.        Behavior: Behavior normal.     Ht '5\' 4"'$  (1.626 m)   BMI 32.85  kg/m  Wt Readings from Last 3 Encounters:  04/27/22 191 lb 6.4 oz (86.8 kg)  03/30/22 189 lb 3.2 oz (85.8 kg)  03/24/22 185 lb (83.9 kg)     Health Maintenance Due  Topic Date Due   COVID-19 Vaccine (1) Never done   Hepatitis C Screening  Never done   TETANUS/TDAP  Never done   COLONOSCOPY (Pts 45-21yr Insurance coverage will need to be confirmed)  Never done   MAMMOGRAM  Never done   Zoster Vaccines- Shingrix (1 of 2) Never done   Pneumonia Vaccine 71 Years old (1 - PCV) Never done   DEXA SCAN  Never done    There are no preventive care reminders to display for this patient.  No results found for: "TSH" Lab Results  Component Value Date   WBC 9.3 03/24/2022   HGB 11.3 (L) 03/24/2022   HCT 35.5 (L) 03/24/2022   MCV 94.4 03/24/2022   PLT 203 03/24/2022   Lab Results  Component Value Date   NA 137 03/24/2022   K 4.2 03/24/2022   CO2 23 03/24/2022   GLUCOSE 105 (H) 03/24/2022   BUN 23 03/24/2022   CREATININE 1.72 (H) 03/24/2022   CALCIUM 7.8 (L) 03/24/2022   ANIONGAP 9 03/24/2022   No results found for: "CHOL" No results found for: "HDL" No results found for: "LDLCALC" No results found for: "TRIG" No results found for: "CHOLHDL" No results found for: "HGBA1C"    Assessment & Plan:   Problem List Items Addressed This Visit       Musculoskeletal and Integument   Atopic neurodermatitis - Primary    Refilled triamcinolone cream.  Patient has dermatology  appointment next month.      Relevant Medications   cetirizine (ZYRTEC) 10 MG tablet   triamcinolone cream (KENALOG) 0.1 %   Other Visit Diagnoses      Other chronic pain       Relevant Orders   Shower chair       Meds ordered this encounter  Medications   cetirizine (ZYRTEC) 10 MG tablet    Sig: Take 1 tablet (10 mg total) by mouth daily.    Dispense:  60 tablet    Refill:  1   triamcinolone cream (KENALOG) 0.1 %    Sig: Apply 1 application. topically 2 (two) times daily.    Dispense:  30 g    Refill:  0   DISCONTD: Misc. Devices (WALL GRAB BAR) MISC    Sig: 2 each by Does not apply route daily as needed.    Dispense:  2 each    Refill:  0   Misc. Devices (WALL GRAB BAR) MISC    Sig: 1 each by Does not apply route as directed.    Dispense:  1 each    Refill:  0     Follow-up: No follow-ups on file.    CTheresia Lo NP

## 2022-05-25 NOTE — Assessment & Plan Note (Signed)
Refilled triamcinolone cream.  Patient has dermatology  appointment next month.

## 2022-06-09 ENCOUNTER — Other Ambulatory Visit: Payer: Self-pay

## 2022-06-09 ENCOUNTER — Ambulatory Visit
Payer: Medicare Other | Attending: Student in an Organized Health Care Education/Training Program | Admitting: Student in an Organized Health Care Education/Training Program

## 2022-06-09 ENCOUNTER — Encounter: Payer: Self-pay | Admitting: Student in an Organized Health Care Education/Training Program

## 2022-06-09 VITALS — BP 144/90 | HR 68 | Temp 97.2°F | Resp 16 | Ht 64.0 in | Wt 191.0 lb

## 2022-06-09 DIAGNOSIS — Z95810 Presence of automatic (implantable) cardiac defibrillator: Secondary | ICD-10-CM | POA: Insufficient documentation

## 2022-06-09 DIAGNOSIS — M25512 Pain in left shoulder: Secondary | ICD-10-CM | POA: Insufficient documentation

## 2022-06-09 DIAGNOSIS — G5793 Unspecified mononeuropathy of bilateral lower limbs: Secondary | ICD-10-CM | POA: Diagnosis not present

## 2022-06-09 DIAGNOSIS — M25561 Pain in right knee: Secondary | ICD-10-CM | POA: Insufficient documentation

## 2022-06-09 DIAGNOSIS — M5136 Other intervertebral disc degeneration, lumbar region: Secondary | ICD-10-CM | POA: Diagnosis not present

## 2022-06-09 DIAGNOSIS — M19012 Primary osteoarthritis, left shoulder: Secondary | ICD-10-CM | POA: Insufficient documentation

## 2022-06-09 DIAGNOSIS — G894 Chronic pain syndrome: Secondary | ICD-10-CM | POA: Diagnosis not present

## 2022-06-09 DIAGNOSIS — M25562 Pain in left knee: Secondary | ICD-10-CM | POA: Diagnosis not present

## 2022-06-09 DIAGNOSIS — M47816 Spondylosis without myelopathy or radiculopathy, lumbar region: Secondary | ICD-10-CM | POA: Insufficient documentation

## 2022-06-09 DIAGNOSIS — M51369 Other intervertebral disc degeneration, lumbar region without mention of lumbar back pain or lower extremity pain: Secondary | ICD-10-CM | POA: Insufficient documentation

## 2022-06-09 DIAGNOSIS — G8929 Other chronic pain: Secondary | ICD-10-CM | POA: Insufficient documentation

## 2022-06-09 MED ORDER — GABAPENTIN 400 MG PO CAPS: 400.0000 mg | ORAL_CAPSULE | Freq: Three times a day (TID) | ORAL | 1 refills | Status: DC

## 2022-06-09 NOTE — Progress Notes (Signed)
Safety precautions to be maintained throughout the outpatient stay will include: orient to surroundings, keep bed in low position, maintain call bell within reach at all times, provide assistance with transfer out of bed and ambulation.  

## 2022-06-09 NOTE — Progress Notes (Signed)
Patient: Valerie Munoz  Service Category: E/M  Provider: Gillis Santa, MD  DOB: Aug 29, 1951  DOS: 06/09/2022  Referring Provider: Cletis Athens, MD  MRN: [id number]  Setting: Ambulatory outpatient  PCP: Cletis Athens, MD  Type: New Patient  Specialty: Interventional Pain Management    Location: Office  Delivery: Face-to-face     Primary Reason(s) for Visit: Encounter for initial evaluation of one or more chronic problems (new to examiner) potentially causing chronic pain, and posing a threat to normal musculoskeletal function. (Level of risk: High) CC: Foot Pain (Peripheral neuropathy bilaterally and edema ), Back Pain (Lumbar bilateral ), and Knee Pain (Left )  HPI  Ms. Shimon is a 71 y.o. year old, female patient, who comes for the first time to our practice referred by Cletis Athens, MD for our initial evaluation of her chronic pain. She has AICD (automatic cardioverter/defibrillator) present; Concussion with unknown loss of consciousness status; Neuropathy; Class 1 obesity due to excess calories without serious comorbidity with body mass index (BMI) of 32.0 to 32.9 in adult; Thyroid cyst; Atopic neurodermatitis; Chronic pain of both knees; Neuropathic pain of both legs; Chronic left shoulder pain; Lumbar degenerative disc disease; Lumbar facet arthropathy; Primary osteoarthritis of left shoulder; and Chronic pain syndrome on their problem list. Today she comes in for evaluation of her Foot Pain (Peripheral neuropathy bilaterally and edema ), Back Pain (Lumbar bilateral ), and Knee Pain (Left )  Pain Assessment: Location: Lower, Left, Right Back (feet and left knee) Radiating: back pain into hips and legs.  foot pain up the shins. has a rash on feet and is wondering if it could be coming from the pain. Onset: More than a month ago Duration: Chronic pain Quality: Discomfort, Constant, Tingling, Numbness, Other (Comment) (edema in feet) Severity: 8 /10 (subjective, self-reported pain score)  Effect  on ADL: very limiting every day.  patient reports that she has stairs in the house. Timing: Constant Modifying factors: nothing currently BP: (!) 144/90  HR: 68  Onset and Duration: Gradual Cause of pain: Unknown Severity: NAS-11 at its worse: 10/10, NAS-11 at its best: 8/10, NAS-11 now: 10/10, and NAS-11 on the average: 8/10 Timing: Morning, Noon, Afternoon, and Night Aggravating Factors: Bending, Climbing, Kneeling, Squatting, and Stooping  Alleviating Factors: Relaxation therapy Associated Problems: Numbness, Pain that wakes patient up, and Pain that does not allow patient to sleep Quality of Pain: Constant, Disabling, Feeling of weight, Getting longer, Itching, Nagging, and Uncomfortable Previous Examinations or Tests: EMG/PNCV Previous Treatments: The patient denies treatments.  Linton Rump is a pleasant 71 year old female who recently moved from [place] who presents with a chief complaint of bilateral lower extremity paresthesias, left greater than right, left shoulder pain, axial low back pain, bilateral knee pain.  Of note she has a history of cranial surgery and was seeing neurology in the past in [place].  She is currently on gabapentin 300 mg 3 times a day and Cymbalta 30 mg daily.  She states that she has limited analgesic response to the Cymbalta even at higher doses.  She has not tried a higher dose of gabapentin.  She has not had any x-rays of her shoulder low back bilateral knees.  She has done physical therapy in the past, it was many years ago.  She was also on oxycodone at a pain clinic in [place] many years ago but prefers to stay off of that for now.  She is interested in nonopioid and interventional based treatments.  She is being referred  from her primary care provider.  Historic Controlled Substance Pharmacotherapy Review   Historical Monitoring: The patient  reports no history of drug use. List of all UDS Test(s): No results found for: "MDMA", "COCAINSCRNUR",  "PCPSCRNUR", "PCPQUANT", "CANNABQUANT", "THCU", "ETH" List of other Serum/Urine Drug Screening Test(s):  No results found for: "AMPHSCRSER", "BARBSCRSER", "BENZOSCRSER", "COCAINSCRSER", "COCAINSCRNUR", "PCPSCRSER", "PCPQUANT", "THCSCRSER", "THCU", "CANNABQUANT", "OPIATESCRSER", "OXYSCRSER", "PROPOXSCRSER", "ETH" Historical Background Evaluation: [place] PMP: PDMP reviewed during this encounter. Review of the past 110-month conducted.              [place] Department of public safety, offender search: (Editor, commissioningInformation) Non-contributory Risk Assessment Profile: Aberrant behavior: None observed or detected today Risk factors for fatal opioid overdose: None identified today Fatal overdose hazard ratio (HR): Calculation deferred Non-fatal overdose hazard ratio (HR): Calculation deferred Risk of opioid abuse or dependence: 0.7-3.0% with doses ? 36 MME/day and 6.1-26% with doses ? 120 MME/day. Substance use disorder (SUD) risk level: See below Personal History of Substance Abuse (SUD-Substance use disorder):  Alcohol: Negative  Illegal Drugs: Negative  Rx Drugs: Negative  ORT Risk Level calculation: Low Risk  Opioid Risk Tool - 06/09/22 1335       Family History of Substance Abuse   Alcohol Negative    Illegal Drugs Negative    Rx Drugs Negative      Personal History of Substance Abuse   Alcohol Negative    Illegal Drugs Negative    Rx Drugs Negative      Psychological Disease   Psychological Disease Negative    Depression Negative      Total Score   Opioid Risk Tool Scoring 0    Opioid Risk Interpretation Low Risk            ORT Scoring interpretation table:  Score <3 = Low Risk for SUD  Score between 4-7 = Moderate Risk for SUD  Score >8 = High Risk for Opioid Abuse   PHQ-2 Depression Scale:  Total score:    PHQ-2 Scoring interpretation table: (Score and probability of major depressive disorder)  Score 0 = No depression  Score 1 = 15.4% Probability  Score 2 = 21.1%  Probability  Score 3 = 38.4% Probability  Score 4 = 45.5% Probability  Score 5 = 56.4% Probability  Score 6 = 78.6% Probability   PHQ-9 Depression Scale:  Total score:    PHQ-9 Scoring interpretation table:  Score 0-4 = No depression  Score 5-9 = Mild depression  Score 10-14 = Moderate depression  Score 15-19 = Moderately severe depression  Score 20-27 = Severe depression (2.4 times higher risk of SUD and 2.89 times higher risk of overuse)   Pharmacologic Plan: As per protocol, I have not taken over any controlled substance management, pending the results of ordered tests and/or consults.            Initial impression: Pending review of available data and ordered tests.  Meds   Current Outpatient Medications:    carvedilol (COREG) 25 MG tablet, Take 25 mg by mouth 2 (two) times daily., Disp: , Rfl:    cetirizine (ZYRTEC) 10 MG tablet, Take 1 tablet (10 mg total) by mouth daily., Disp: 60 tablet, Rfl: 1   DULoxetine (CYMBALTA) 30 MG capsule, Take 30 mg by mouth daily., Disp: , Rfl:    folic acid (FOLVITE) 1 MG tablet, Take 1 mg by mouth daily., Disp: , Rfl:    furosemide (LASIX) 20 MG tablet, Take 20 mg by mouth every other day.,  Disp: , Rfl:    gabapentin (NEURONTIN) 400 MG capsule, Take 1 capsule (400 mg total) by mouth 3 (three) times daily., Disp: 90 capsule, Rfl: 1   losartan (COZAAR) 50 MG tablet, Take 50 mg by mouth daily., Disp: , Rfl:    Misc. Devices (ADJUST BATH/SHOWER SEAT) MISC, 1 each by Does not apply route daily as needed., Disp: 1 each, Rfl: 0   Misc. Devices (WALL GRAB BAR) MISC, 1 each by Does not apply route as directed., Disp: 1 each, Rfl: 0   multivitamin (RENA-VIT) TABS tablet, Take 1 tablet by mouth daily., Disp: , Rfl:    OXcarbazepine (TRILEPTAL) 150 MG tablet, Take 150 mg by mouth daily., Disp: , Rfl:    triamcinolone cream (KENALOG) 0.1 %, Apply 1 application. topically 2 (two) times daily., Disp: 30 g, Rfl: 0   traZODone (DESYREL) 50 MG tablet, Take 50 mg  by mouth at bedtime. (Patient not taking: Reported on 06/09/2022), Disp: , Rfl:   Imaging Review  Cervical Imaging:   Narrative CLINICAL DATA:  Neck trauma (Age >= 65y)  EXAM: CT CERVICAL SPINE WITHOUT CONTRAST  TECHNIQUE: Multidetector CT imaging of the cervical spine was performed without intravenous contrast. Multiplanar CT image reconstructions were also generated.  RADIATION DOSE REDUCTION: This exam was performed according to the departmental dose-optimization program which includes automated exposure control, adjustment of the mA and/or kV according to patient size and/or use of iterative reconstruction technique.  COMPARISON:  None.  FINDINGS: Alignment: Straightening. No substantial sagittal subluxation. Mild broad dextrocurvature.  Skull base and vertebrae: Vertebral body heights are maintained. No evidence of acute fracture.  Soft tissues and spinal canal: No prevertebral fluid or swelling. No visible canal hematoma.  Disc levels: Moderate multilevel degenerative disc disease, including disc height loss, endplate sclerosis and endplate spurring.  Upper chest: Visualized lung apices are clear.  Other: Approximately 1.5 cm right thyroid nodule.  IMPRESSION: 1. No evidence of acute fracture or traumatic malalignment. 2. Moderate multilevel degenerative disc disease. 3. Approximately 1.5 cm right thyroid nodule. Recommend thyroid ultrasound (ref: J Am Coll Radiol. 2015 Feb;12(2): 143-50).   Electronically Signed By: Margaretha Sheffield M.D. On: 03/24/2022 15:49 DG Knee Complete 4 Views Right  Narrative CLINICAL DATA:  Fall, pain.  EXAM: RIGHT KNEE - COMPLETE 4+ VIEW  COMPARISON:  None.  FINDINGS: No evidence of fracture, dislocation, or joint effusion. Tricompartmental joint space narrowing prominent in the medial tibiofemoral and patellofemoral compartments with marginal osteophytes. Small suprapatellar joint effusion. Heterotopic soft tissue  ossification, likely sequela of prior trauma.  IMPRESSION: 1. No evidence of acute fracture or dislocation. 2. Mild-to-moderate tricompartmental osteoarthritis. 3. 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   Electronically Signed By: Keane Police D.O. On: 03/24/2022 17:19  Complexity Note: Imaging results reviewed. Results shared with Ms. Pekar, using [employer].                         ROS  Cardiovascular: Heart trouble, Pacemaker or defibrillator, and Weak heart (CHF) Pulmonary or Respiratory: No reported pulmonary signs or symptoms such as wheezing and difficulty taking a deep full breath (Asthma), difficulty blowing air out (Emphysema), coughing up mucus (Bronchitis), persistent dry cough, or temporary stoppage of breathing during sleep Neurological: No reported neurological signs or symptoms such as seizures, abnormal skin sensations, urinary and/or fecal incontinence, being born with an abnormal open spine and/or a tethered spinal cord Psychological-Psychiatric: Anxiousness  and Depressed Gastrointestinal: Reflux or heatburn Genitourinary: No reported renal or genitourinary signs or symptoms such as difficulty voiding or producing urine, peeing blood, non-functioning kidney, kidney stones, difficulty emptying the bladder, difficulty controlling the flow of urine, or chronic kidney disease Hematological: No reported hematological signs or symptoms such as prolonged bleeding, low or poor functioning platelets, bruising or bleeding easily, hereditary bleeding problems, low energy levels due to low hemoglobin or being anemic Endocrine: No reported endocrine signs or symptoms such as high or low blood sugar, rapid heart rate due to high thyroid levels, obesity or weight gain due to slow thyroid or thyroid disease Rheumatologic: No reported rheumatological signs and symptoms such as fatigue, joint pain,  tenderness, swelling, redness, heat, stiffness, decreased range of motion, with or without associated rash Musculoskeletal: Negative for myasthenia gravis, muscular dystrophy, multiple sclerosis or malignant hyperthermia Work History: Unemployed  Allergies  Ms. Galyon has No Known Allergies.  Laboratory Chemistry Profile   Renal Lab Results  Component Value Date   BUN 23 03/24/2022   CREATININE 1.72 (H) 03/24/2022   GFRNONAA 32 (L) 03/24/2022   PROTEINUR >=300 (A) 03/24/2022     Electrolytes Lab Results  Component Value Date   NA 137 03/24/2022   K 4.2 03/24/2022   CL 105 03/24/2022   CALCIUM 7.8 (L) 03/24/2022     Hepatic No results found for: "AST", "ALT", "ALBUMIN", "ALKPHOS", "AMYLASE", "LIPASE", "AMMONIA"   ID No results found for: "LYMEIGGIGMAB", "HIV", "SARSCOV2NAA", "STAPHAUREUS", "MRSAPCR", "HCVAB", "PREGTESTUR", "RMSFIGG", "QFVRPH1IGG", "QFVRPH2IGG"   Bone No results found for: "VD25OH", "VD125OH2TOT", "JZ7915AV6", "PV9480XK5", "25OHVITD1", "25OHVITD2", "25OHVITD3", "TESTOFREE", "TESTOSTERONE"   Endocrine Lab Results  Component Value Date   GLUCOSE 105 (H) 03/24/2022   GLUCOSEU NEGATIVE 03/24/2022     Neuropathy No results found for: "VITAMINB12", "FOLATE", "HGBA1C", "HIV"   CNS No results found for: "COLORCSF", "APPEARCSF", "RBCCOUNTCSF", "WBCCSF", "POLYSCSF", "LYMPHSCSF", "EOSCSF", "PROTEINCSF", "GLUCCSF", "JCVIRUS", "CSFOLI", "IGGCSF", "LABACHR", "ACETBL"   Inflammation (CRP: Acute  ESR: Chronic) No results found for: "CRP", "ESRSEDRATE", "LATICACIDVEN"   Rheumatology No results found for: "RF", "ANA", "LABURIC", "URICUR", "LYMEIGGIGMAB", "LYMEABIGMQN", "HLAB27"   Coagulation Lab Results  Component Value Date   PLT 203 03/24/2022     Cardiovascular Lab Results  Component Value Date   HGB 11.3 (L) 03/24/2022   HCT 35.5 (L) 03/24/2022     Screening No results found for: "SARSCOV2NAA", "COVIDSOURCE", "STAPHAUREUS", "MRSAPCR", "HCVAB", "HIV",  "PREGTESTUR"   Cancer No results found for: "CEA", "CA125", "LABCA2"   Allergens No results found for: "ALMOND", "APPLE", "ASPARAGUS", "AVOCADO", "BANANA", "BARLEY", "BASIL", "BAYLEAF", "GREENBEAN", "LIMABEAN", "WHITEBEAN", "BEEFIGE", "REDBEET", "BLUEBERRY", "BROCCOLI", "CABBAGE", "MELON", "CARROT", "CASEIN", "CASHEWNUT", "CAULIFLOWER", "CELERY"     Note: Lab results reviewed.  [place]  Drug: Ms. Cogan  reports no history of drug use. Alcohol:  reports that she does not currently use alcohol. Tobacco:  reports that she has never smoked. She has never used smokeless tobacco. Medical:  has a past medical history of Heart disease, Hypertension, Migraine headache, and Neuropathy. Family: family history is not on file.  Past Surgical History:  Procedure Laterality Date   ABDOMINAL HYSTERECTOMY     eye tumor removal  1977   Active Ambulatory Problems    Diagnosis Date Noted   AICD (automatic cardioverter/defibrillator) present 03/30/2022   Concussion with unknown loss of consciousness status 03/30/2022   Neuropathy 03/30/2022   Class 1 obesity due to excess calories without serious comorbidity with body mass index (BMI) of 32.0 to 32.9 in adult 03/30/2022   Thyroid cyst  03/30/2022   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   Chronic pain of both knees 06/09/2022   Neuropathic pain of both legs 06/09/2022   Chronic left shoulder pain 06/09/2022   Lumbar degenerative disc disease 06/09/2022   Lumbar facet arthropathy 06/09/2022   Primary osteoarthritis of left shoulder 06/09/2022   Chronic pain syndrome 06/09/2022   Resolved Ambulatory Problems    Diagnosis Date Noted   No Resolved Ambulatory Problems   Past Medical History:  Diagnosis Date   Heart disease    Hypertension    Migraine headache    Constitutional Exam  General appearance: Well nourished, well developed, and well hydrated. In no apparent acute distress Vitals:   06/09/22 1328  BP: (!) 144/90  Pulse: 68  Resp: 16   Temp: (!) 97.2 F (36.2 C)  TempSrc: Temporal  SpO2: 98%  Weight: 191 lb (86.6 kg)  Height: 5' 4"  (1.626 m)   BMI Assessment: Estimated body mass index is 32.79 kg/m as calculated from the following:   Height as of this encounter: 5' 4"  (1.626 m).   Weight as of this encounter: 191 lb (86.6 kg).  BMI interpretation table: BMI level Category Range association with higher incidence of chronic pain  <18 kg/m2 Underweight   18.5-24.9 kg/m2 Ideal body weight   25-29.9 kg/m2 Overweight Increased incidence by 20%  30-34.9 kg/m2 Obese (Class I) Increased incidence by 68%  35-39.9 kg/m2 Severe obesity (Class II) Increased incidence by 136%  >40 kg/m2 Extreme obesity (Class III) Increased incidence by 254%   Patient's current BMI Ideal Body weight  Body mass index is 32.79 kg/m. Ideal body weight: 54.7 kg (120 lb 9.5 oz) Adjusted ideal body weight: 67.5 kg (148 lb 12.1 oz)   BMI Readings from Last 4 Encounters:  06/09/22 32.79 kg/m  05/20/22 32.85 kg/m  04/27/22 32.85 kg/m  03/30/22 32.48 kg/m   Wt Readings from Last 4 Encounters:  06/09/22 191 lb (86.6 kg)  04/27/22 191 lb 6.4 oz (86.8 kg)  03/30/22 189 lb 3.2 oz (85.8 kg)  03/24/22 185 lb (83.9 kg)    Psych/Mental status: Alert, oriented x 3 (person, place, & time)       Eyes: PERLA Respiratory: No evidence of acute respiratory distress  Cervical Spine Area Exam  Skin & Axial Inspection: No masses, redness, edema, swelling, or associated skin lesions Alignment: Symmetrical Functional ROM: Unrestricted ROM      Stability: No instability detected Muscle Tone/Strength: Functionally intact. No obvious neuro-muscular anomalies detected. Sensory (Neurological): Unimpaired Palpation: No palpable anomalies             Upper Extremity (UE) Exam    Side: Right upper extremity  Side: Left upper extremity  Skin & Extremity Inspection: Skin color, temperature, and hair growth are WNL. No peripheral edema or cyanosis. No masses,  redness, swelling, asymmetry, or associated skin lesions. No contractures.  Skin & Extremity Inspection: Skin color, temperature, and hair growth are WNL. No peripheral edema or cyanosis. No masses, redness, swelling, asymmetry, or associated skin lesions. No contractures.  Functional ROM: Unrestricted ROM          Functional ROM: Pain restricted ROM for shoulder  Muscle Tone/Strength: Functionally intact. No obvious neuro-muscular anomalies detected.  Muscle Tone/Strength: Functionally intact. No obvious neuro-muscular anomalies detected.  Sensory (Neurological): Unimpaired          Sensory (Neurological): Musculoskeletal pain pattern          Palpation: No palpable anomalies  Palpation: No palpable anomalies              Provocative Test(s):  Phalen's test: deferred Tinel's test: deferred Apley's scratch test (touch opposite shoulder):  Action 1 (Across chest): deferred Action 2 (Overhead): deferred Action 3 (LB reach): deferred   Provocative Test(s):  Phalen's test: deferred Tinel's test: deferred Apley's scratch test (touch opposite shoulder):  Action 1 (Across chest): Decreased ROM Action 2 (Overhead): Decreased ROM Action 3 (LB reach): Decreased ROM    Thoracic Spine Area Exam  Skin & Axial Inspection: No masses, redness, or swelling Alignment: Symmetrical Functional ROM: Unrestricted ROM Stability: No instability detected Muscle Tone/Strength: Functionally intact. No obvious neuro-muscular anomalies detected. Sensory (Neurological): Unimpaired Muscle strength & Tone: No palpable anomalies Lumbar Spine Area Exam  Skin & Axial Inspection: No masses, redness, or swelling Alignment: Symmetrical Functional ROM: Pain restricted ROM       Stability: No instability detected Muscle Tone/Strength: Functionally intact. No obvious neuro-muscular anomalies detected. Sensory (Neurological): Musculoskeletal pain pattern Palpation: No palpable anomalies       Provocative  Tests: Hyperextension/rotation test: (+) bilaterally for facet joint pain.  Gait & Posture Assessment  Ambulation: Unassisted Gait: Relatively normal for age and body habitus Posture: WNL   Lower Extremity Exam    Side: Right lower extremity  Side: Left lower extremity  Stability: No instability observed          Stability: No instability observed          Skin & Extremity Inspection: Skin color, temperature, and hair growth are WNL. No peripheral edema or cyanosis. No masses, redness, swelling, asymmetry, or associated skin lesions. No contractures.  Skin & Extremity Inspection: Skin color, temperature, and hair growth are WNL. No peripheral edema or cyanosis. No masses, redness, swelling, asymmetry, or associated skin lesions. No contractures.  Functional ROM: Pain restricted ROM for knee joint          Functional ROM: Pain restricted ROM for knee joint          Muscle Tone/Strength: Functionally intact. No obvious neuro-muscular anomalies detected.  Muscle Tone/Strength: Functionally intact. No obvious neuro-muscular anomalies detected.  Sensory (Neurological): Neuropathic pain pattern      and arthropathic  Sensory (Neurological): Neuropathic pain pattern      and arthropathic  DTR: Patellar: deferred today Achilles: deferred today Plantar: deferred today  DTR: Patellar: deferred today Achilles: deferred today Plantar: deferred today  Palpation: No palpable anomalies  Palpation: No palpable anomalies    Assessment  Primary Diagnosis & Pertinent Problem List: The primary encounter diagnosis was Primary osteoarthritis of left shoulder. Diagnoses of Lumbar facet arthropathy, Lumbar degenerative disc disease, Chronic left shoulder pain, Neuropathic pain of both legs, Chronic pain of both knees, AICD (automatic cardioverter/defibrillator) present, and Chronic pain syndrome were also pertinent to this visit.  Visit Diagnosis (New problems to examiner): 1. Primary osteoarthritis of left  shoulder   2. Lumbar facet arthropathy   3. Lumbar degenerative disc disease   4. Chronic left shoulder pain   5. Neuropathic pain of both legs   6. Chronic pain of both knees   7. AICD (automatic cardioverter/defibrillator) present   8. Chronic pain syndrome    Plan of Care (Initial workup plan)  Note: Ms. Peron was reminded that as per protocol, today's visit has been an evaluation only. We have not taken over the patient's controlled substance management.  General Recommendations: The pain condition that the patient suffers from is best treated with a multidisciplinary approach  that involves an increase in physical activity to prevent de-conditioning and worsening of the pain cycle, as well as psychological counseling (formal and/or informal) to address the co-morbid psychological affects of pain. Treatment will often involve judicious use of pain medications and interventional procedures to decrease the pain, allowing the patient to participate in the physical activity that will ultimately produce long-lasting pain reductions. The goal of the multidisciplinary approach is to return the patient to a higher level of overall function and to restore their ability to perform activities of daily living.  Lab Orders         Vitamin B12         Magnesium         25-Hydroxy vitamin D Lcms D2+D3         Compliance Drug Analysis, Ur     Imaging Orders         DG Knee 1-2 Views Right         DG Knee 1-2 Views Left         DG Lumbar Spine Complete W/Bend         DG Shoulder Left      Pharmacotherapy (current): Medications ordered:  Meds ordered this encounter  Medications   gabapentin (NEURONTIN) 400 MG capsule    Sig: Take 1 capsule (400 mg total) by mouth 3 (three) times daily.    Dispense:  90 capsule    Refill:  1   Medications administered during this visit: Chancy Hurter had no medications administered during this visit.   Pharmacological management options:  Opioid Analgesics:  The patient was informed that there is no guarantee that she would be a candidate for opioid analgesics. The decision will be made following CDC guidelines. This decision will be based on the results of diagnostic studies, as well as Ms. Latner's risk profile.   Membrane stabilizer:  Increase Gabapentin to 400 mg TID  Muscle relaxant: To be determined at a later time  NSAID: To be determined at a later time  Other analgesic(s): To be determined at a later time   Interventional management options: Ms. Ferrufino was informed that there is no guarantee that she would be a candidate for interventional therapies. The decision will be based on the results of diagnostic studies, as well as Ms. Bill's risk profile.  Procedure(s) under consideration:  Left shoulder steroid injection, suprascapular nerve block Lumbar facet medial branch nerve blocks Bilateral knee steroid injection, viscosupplementation, genicular nerve block    Provider-requested follow-up: Return in about 27 days (around 07/06/2022) for Medication Management, in person.  I spent a total of 60 minutes reviewing chart data, face-to-face evaluation with the patient, counseling and coordination of care as detailed above.   Future Appointments  Date Time Provider [place]  07/20/2022 10:00 AM Cletis [place], MD [facility] None  09/23/2022  9:15 AM Ralene Bathe, MD [facility] None    Note by: Gillis Santa, MD Date: 06/09/2022; Time: 2:14 PM

## 2022-06-11 ENCOUNTER — Ambulatory Visit
Admission: RE | Admit: 2022-06-11 | Discharge: 2022-06-11 | Disposition: A | Discharge status: Home / Self Care | Payer: Medicare Other | Attending: Student in an Organized Health Care Education/Training Program | Admitting: Student in an Organized Health Care Education/Training Program

## 2022-06-11 ENCOUNTER — Ambulatory Visit
Admission: RE | Admit: 2022-06-11 | Discharge: 2022-06-11 | Disposition: A | Discharge status: Home / Self Care | Payer: Medicare Other | Source: Ambulatory Visit | Attending: Student in an Organized Health Care Education/Training Program | Admitting: Student in an Organized Health Care Education/Training Program

## 2022-06-11 ENCOUNTER — Other Ambulatory Visit
Admission: RE | Admit: 2022-06-11 | Discharge: 2022-06-11 | Disposition: A | Discharge status: Home / Self Care | Payer: Medicare Other | Source: Home / Self Care | Attending: *Deleted | Admitting: *Deleted

## 2022-06-11 DIAGNOSIS — M48061 Spinal stenosis, lumbar region without neurogenic claudication: Secondary | ICD-10-CM | POA: Diagnosis not present

## 2022-06-11 DIAGNOSIS — Z950 Presence of cardiac pacemaker: Secondary | ICD-10-CM | POA: Diagnosis not present

## 2022-06-11 DIAGNOSIS — G8929 Other chronic pain: Secondary | ICD-10-CM

## 2022-06-11 DIAGNOSIS — M5136 Other intervertebral disc degeneration, lumbar region: Secondary | ICD-10-CM | POA: Insufficient documentation

## 2022-06-11 DIAGNOSIS — I700 Atherosclerosis of aorta: Secondary | ICD-10-CM | POA: Diagnosis not present

## 2022-06-11 DIAGNOSIS — M8589 Other specified disorders of bone density and structure, multiple sites: Secondary | ICD-10-CM | POA: Insufficient documentation

## 2022-06-11 DIAGNOSIS — M19012 Primary osteoarthritis, left shoulder: Secondary | ICD-10-CM | POA: Diagnosis not present

## 2022-06-11 DIAGNOSIS — M4316 Spondylolisthesis, lumbar region: Secondary | ICD-10-CM | POA: Insufficient documentation

## 2022-06-11 DIAGNOSIS — M545 Low back pain, unspecified: Secondary | ICD-10-CM | POA: Diagnosis not present

## 2022-06-11 DIAGNOSIS — M25561 Pain in right knee: Secondary | ICD-10-CM | POA: Insufficient documentation

## 2022-06-11 DIAGNOSIS — M25562 Pain in left knee: Secondary | ICD-10-CM | POA: Insufficient documentation

## 2022-06-11 DIAGNOSIS — G894 Chronic pain syndrome: Secondary | ICD-10-CM

## 2022-06-11 DIAGNOSIS — M47816 Spondylosis without myelopathy or radiculopathy, lumbar region: Secondary | ICD-10-CM

## 2022-06-11 DIAGNOSIS — I517 Cardiomegaly: Secondary | ICD-10-CM | POA: Insufficient documentation

## 2022-06-11 DIAGNOSIS — M170 Bilateral primary osteoarthritis of knee: Secondary | ICD-10-CM | POA: Diagnosis not present

## 2022-06-11 DIAGNOSIS — M25462 Effusion, left knee: Secondary | ICD-10-CM | POA: Diagnosis not present

## 2022-06-11 LAB — VITAMIN D 25 HYDROXY (VIT D DEFICIENCY, FRACTURES): Vit D, 25-Hydroxy: 11.07 ng/mL — ABNORMAL LOW (ref 30–100)

## 2022-06-11 LAB — VITAMIN B12: Vitamin B-12: 545 pg/mL (ref 180–914)

## 2022-06-11 LAB — MAGNESIUM: Magnesium: 2.2 mg/dL (ref 1.7–2.4)

## 2022-06-14 LAB — COMPLIANCE DRUG ANALYSIS, UR

## 2022-06-17 ENCOUNTER — Telehealth: Payer: Self-pay | Admitting: *Deleted

## 2022-06-17 ENCOUNTER — Other Ambulatory Visit: Payer: Self-pay | Admitting: Student in an Organized Health Care Education/Training Program

## 2022-06-17 DIAGNOSIS — R7989 Other specified abnormal findings of blood chemistry: Secondary | ICD-10-CM

## 2022-06-17 MED ORDER — ERGOCALCIFEROL 1.25 MG (50000 UT) PO CAPS: 50000.0000 [IU] | ORAL_CAPSULE | ORAL | 0 refills | Status: DC

## 2022-06-17 NOTE — Progress Notes (Signed)
Patient aware of vitamin D results and supplement that was sent in.

## 2022-06-17 NOTE — Telephone Encounter (Signed)
Spoke with patient about lab work and supplement that has been sent in.  Patient verbalizes u/o information.

## 2022-07-05 ENCOUNTER — Ambulatory Visit
Payer: Medicare Other | Attending: Student in an Organized Health Care Education/Training Program | Admitting: Student in an Organized Health Care Education/Training Program

## 2022-07-05 ENCOUNTER — Encounter: Payer: Self-pay | Admitting: Student in an Organized Health Care Education/Training Program

## 2022-07-05 VITALS — BP 131/91 | HR 64 | Temp 96.9°F | Resp 16 | Ht 64.0 in | Wt 191.0 lb

## 2022-07-05 DIAGNOSIS — M172 Bilateral post-traumatic osteoarthritis of knee: Secondary | ICD-10-CM | POA: Insufficient documentation

## 2022-07-05 DIAGNOSIS — M25512 Pain in left shoulder: Secondary | ICD-10-CM | POA: Diagnosis not present

## 2022-07-05 DIAGNOSIS — M19012 Primary osteoarthritis, left shoulder: Secondary | ICD-10-CM | POA: Diagnosis not present

## 2022-07-05 DIAGNOSIS — M25562 Pain in left knee: Secondary | ICD-10-CM | POA: Insufficient documentation

## 2022-07-05 DIAGNOSIS — G894 Chronic pain syndrome: Secondary | ICD-10-CM | POA: Insufficient documentation

## 2022-07-05 DIAGNOSIS — G8929 Other chronic pain: Secondary | ICD-10-CM | POA: Diagnosis not present

## 2022-07-05 DIAGNOSIS — M25561 Pain in right knee: Secondary | ICD-10-CM | POA: Diagnosis not present

## 2022-07-05 DIAGNOSIS — R7989 Other specified abnormal findings of blood chemistry: Secondary | ICD-10-CM | POA: Insufficient documentation

## 2022-07-05 NOTE — Progress Notes (Signed)
PROVIDER NOTE: Information contained herein reflects review and annotations entered in association with encounter. Interpretation of such information and data should be left to medically-trained personnel. Information provided to patient can be located elsewhere in the medical record under "Patient Instructions". Document created using STT-dictation technology, any transcriptional errors that may result from process are unintentional.    Patient: Valerie Munoz  Service Category: E/M  Provider: Gillis Santa, MD  DOB: Aug 12, 1951  DOS: 07/05/2022  Specialty: Interventional Pain Management  MRN: [id number]  Setting: Ambulatory outpatient  PCP: Cletis Athens, MD  Type: Established Patient    Referring Provider: Cletis Athens, MD  Location: Office  Delivery: Face-to-face     Primary Reason(s) for Visit: Encounter for evaluation before starting new chronic pain management plan of care (Level of risk: moderate) CC: Back Pain (lower), Knee Pain (left), Pain (foot), Shoulder Pain (left), and Foot Pain (bilat)  HPI  Valerie Munoz is a 71 y.o. year old, female patient, who comes today for a follow-up evaluation to review the test results and decide on a treatment plan. She has AICD (automatic cardioverter/defibrillator) present; Concussion with unknown loss of consciousness status; Neuropathy; Class 1 obesity due to excess calories without serious comorbidity with body mass index (BMI) of 32.0 to 32.9 in adult; Thyroid cyst; Atopic neurodermatitis; Chronic pain of both knees; Neuropathic pain of both legs; Chronic left shoulder pain; Lumbar degenerative disc disease; Lumbar facet arthropathy; Arthritis of left glenohumeral joint; Chronic pain syndrome; and Bilateral post-traumatic osteoarthritis of knee on their problem list. Her primarily concern today is the Back Pain (lower), Knee Pain (left), Pain (foot), Shoulder Pain (left), and Foot Pain (bilat)  Pain Assessment: Location: Lower Back Radiating: back pain goes  into hips and legs bilat, foot pain goes up to shins bilat, rash on tops of feet and ankles bilat; left shoulder Onset:   Duration: Chronic pain Quality: Discomfort, Constant, Tingling, Numbness Severity: 8 /10 (subjective, self-reported pain score)  Effect on ADL: limits daily activities Timing: Constant Modifying factors: nothing currently BP: (!) 131/91  HR: 64  Valerie Munoz comes in today for a follow-up visit after her initial evaluation on 06/17/2022. Today we went over the results of her tests. These were explained in "Layman's terms". During today's appointment we went over my diagnostic impression, as well as the proposed treatment plan.  Patient presents today for her second patient visit.  Today we reviewed her imaging studies.  She has left glenohumeral arthritis, lumbar degenerative disc disease and facet hypertrophy at L3, L4, L5 along with bilateral knee osteoarthritis.  She has done physical therapy in the past.  She has not had any shoulder or knee injections done.  She does have pain with shoulder abduction and with weightbearing respectively.  Plan for left glenohumeral joint injection under fluoroscopy as well as bilateral intra-articular knee steroid injection.  Risk and benefits were reviewed and patient would like to proceed.  Of note patient did not receive any benefit from gabapentin so she has discontinued.  She does have low vitamin D which I have encouraged her to supplement with vitamin D supplementation.  We will recheck in approximately 6 to 8 months.     HPI from initial clinic visit: Valerie Munoz is a pleasant 71 year old female who recently moved from [place] who presents with a chief complaint of bilateral lower extremity paresthesias, left greater than right, left shoulder pain, axial low back pain, bilateral knee pain.  Of note she has a history of cranial surgery and was seeing  neurology in the past in [place].  She is currently on gabapentin 300 mg 3 times a  day and Cymbalta 30 mg daily.  She states that she has limited analgesic response to the Cymbalta even at higher doses.  She has not tried a higher dose of gabapentin.  She has not had any x-rays of her shoulder low back bilateral knees.  She has done physical therapy in the past, it was many years ago.  She was also on oxycodone at a pain clinic in [place] many years ago but prefers to stay off of that for now.  She is interested in nonopioid and interventional based treatments.  She is being referred from her primary care provider.  Further details on both, my assessment(s), as well as the proposed treatment plan, please see below.  Controlled Substance Pharmacotherapy Assessment REMS (Risk Evaluation and Mitigation Strategy)   Monitoring: [place] PMP: PDMP reviewed during this encounter. Online review of the past 32-monthperiod previously conducted. Not applicable at this point since we have not taken over the patient's medication management yet. List of other Serum/Urine Drug Screening Test(s):  No results found for: "AMPHSCRSER", "BARBSCRSER", "BENZOSCRSER", "COCAINSCRSER", "COCAINSCRNUR", "PCPSCRSER", "THCSCRSER", "THCU", "CANNABQUANT", "OPIATESCRSER", "OXYSCRSER", "PROPOXSCRSER", "ETH", "CBDTHCR", "D8THCCBX", "D9THCCBX" List of all UDS test(s) done:  Lab Results  Component Value Date   SUMMARY Note 06/09/2022   Last UDS on record: Summary  Date Value Ref Range Status  06/09/2022 Note  Final    Comment:    ==================================================================== Compliance Drug Analysis, Ur ==================================================================== Test                             Result       Flag       Units  Drug Present and Declared for Prescription Verification   Gabapentin                     PRESENT      EXPECTED   Oxcarbazepine MHD              PRESENT      EXPECTED    Oxcarbazepine MHD is the active metabolite of oxcarbazepine and     eslicarbazepine.    Trazodone                      PRESENT      EXPECTED   1,3 chlorophenyl piperazine    PRESENT      EXPECTED    1,3-chlorophenyl piperazine is an expected metabolite of trazodone.  Drug Present not Declared for Prescription Verification   Carboxy-THC                    47           UNEXPECTED ng/mg creat    Carboxy-THC is a metabolite of tetrahydrocannabinol (THC). Source of    THC is most commonly herbal marijuana or marijuana-based products,    but THC is also present in a scheduled prescription medication.    Trace amounts of THC can be present in hemp and cannabidiol (CBD)    products. This test is not intended to distinguish between delta-9-    tetrahydrocannabinol, the predominant form of THC in most herbal or    marijuana-based products, and delta-8-tetrahydrocannabinol.  Drug Absent but Declared for Prescription Verification   Duloxetine  Not Detected UNEXPECTED ==================================================================== Test                      Result    Flag   Units      Ref Range   Creatinine              135              mg/dL      >=20 ==================================================================== Declared Medications:  The flagging and interpretation on this report are based on the  following declared medications.  Unexpected results may arise from  inaccuracies in the declared medications.   **Note: The testing scope of this panel includes these medications:   Duloxetine (Cymbalta)  Gabapentin (Neurontin)  Oxcarbazepine (Trileptal)  Trazodone (Desyrel)   **Note: The testing scope of this panel does not include the  following reported medications:   Carvedilol (Coreg)  Cetirizine (Zyrtec)  Folic Acid  Furosemide (Lasix)  Losartan (Cozaar)  Triamcinolone (Kenalog)  Vitamin B ==================================================================== For clinical consultation, please call [phone]. ====================================================================    UDS interpretation: No unexpected findings.          Medication Assessment Form: Not applicable. No opioids. Treatment compliance: Not applicable Risk Assessment Profile: Aberrant behavior: See initial evaluations. None observed or detected today Comorbid factors increasing risk of overdose: See initial evaluation. No additional risks detected today Opioid risk tool (ORT):     06/09/2022    1:35 PM  Opioid Risk   Alcohol 0  Illegal Drugs 0  Rx Drugs 0  Alcohol 0  Illegal Drugs 0  Rx Drugs 0  Psychological Disease 0  Depression 0  Opioid Risk Tool Scoring 0  Opioid Risk Interpretation Low Risk    ORT Scoring interpretation table:  Score <3 = Low Risk for SUD  Score between 4-7 = Moderate Risk for SUD  Score >8 = High Risk for Opioid Abuse   Risk of substance use disorder (SUD): Low  Risk Mitigation Strategies:  Patient opioid safety counseling: No controlled substances prescribed. Patient-Prescriber Agreement (PPA): No agreement signed.  Controlled substance notification to other providers: None required. No opioid therapy.  Pharmacologic Plan: Non-opioid analgesic therapy offered. Interventional alternatives discussed.             Laboratory Chemistry Profile   Renal Lab Results  Component Value Date   BUN 23 03/24/2022   CREATININE 1.72 (H) 03/24/2022   GFRNONAA 32 (L) 03/24/2022   PROTEINUR >=300 (A) 03/24/2022     Electrolytes Lab Results  Component Value Date   NA 137 03/24/2022   K 4.2 03/24/2022   CL 105 03/24/2022   CALCIUM 7.8 (L) 03/24/2022   MG 2.2 06/11/2022     Hepatic No results found for: "AST", "ALT", "ALBUMIN", "ALKPHOS", "AMYLASE", "LIPASE", "AMMONIA"   ID No results found for: "LYMEIGGIGMAB", "HIV", "SARSCOV2NAA", "STAPHAUREUS", "MRSAPCR", "HCVAB", "PREGTESTUR", "RMSFIGG", "QFVRPH1IGG", "QFVRPH2IGG"   Bone Lab Results  Component Value Date   VD25OH  11.07 (L) 06/11/2022     Endocrine Lab Results  Component Value Date   GLUCOSE 105 (H) 03/24/2022   GLUCOSEU NEGATIVE 03/24/2022     Neuropathy Lab Results  Component Value Date   OXBDZHGD92 426 06/11/2022     CNS No results found for: "COLORCSF", "APPEARCSF", "RBCCOUNTCSF", "WBCCSF", "POLYSCSF", "LYMPHSCSF", "EOSCSF", "PROTEINCSF", "GLUCCSF", "JCVIRUS", "CSFOLI", "IGGCSF", "LABACHR", "ACETBL"   Inflammation (CRP: Acute  ESR: Chronic) No results found for: "CRP", "ESRSEDRATE", "LATICACIDVEN"   Rheumatology No results found for: "RF", "ANA", "LABURIC", "URICUR", "  LYMEIGGIGMAB", "LYMEABIGMQN", "HLAB27"   Coagulation Lab Results  Component Value Date   PLT 203 03/24/2022     Cardiovascular Lab Results  Component Value Date   HGB 11.3 (L) 03/24/2022   HCT 35.5 (L) 03/24/2022     Screening No results found for: "SARSCOV2NAA", "COVIDSOURCE", "STAPHAUREUS", "MRSAPCR", "HCVAB", "HIV", "PREGTESTUR"   Cancer No results found for: "CEA", "CA125", "LABCA2"   Allergens No results found for: "ALMOND", "APPLE", "ASPARAGUS", "AVOCADO", "BANANA", "BARLEY", "BASIL", "BAYLEAF", "GREENBEAN", "LIMABEAN", "WHITEBEAN", "BEEFIGE", "REDBEET", "BLUEBERRY", "BROCCOLI", "CABBAGE", "MELON", "CARROT", "CASEIN", "CASHEWNUT", "CAULIFLOWER", "CELERY"     Note: Lab results reviewed.  Recent Diagnostic Imaging Review  Cervical Imaging: Cervical MR wo contrast: No results found for this or any previous visit.  Cervical MR wo contrast: No valid procedures specified. Cervical MR w/wo contrast: No results found for this or any previous visit.  Cervical CT wo contrast: Results for orders placed during the hospital encounter of 03/24/22  CT Cervical Spine Wo Contrast  Narrative CLINICAL DATA:  Neck trauma (Age >= 65y)  EXAM: CT CERVICAL SPINE WITHOUT CONTRAST  TECHNIQUE: Multidetector CT imaging of the cervical spine was performed without intravenous contrast. Multiplanar CT image  reconstructions were also generated.  RADIATION DOSE REDUCTION: This exam was performed according to the departmental dose-optimization program which includes automated exposure control, adjustment of the mA and/or kV according to patient size and/or use of iterative reconstruction technique.  COMPARISON:  None.  FINDINGS: Alignment: Straightening. No substantial sagittal subluxation. Mild broad dextrocurvature.  Skull base and vertebrae: Vertebral body heights are maintained. No evidence of acute fracture.  Soft tissues and spinal canal: No prevertebral fluid or swelling. No visible canal hematoma.  Disc levels: Moderate multilevel degenerative disc disease, including disc height loss, endplate sclerosis and endplate spurring.  Upper chest: Visualized lung apices are clear.  Other: Approximately 1.5 cm right thyroid nodule.  IMPRESSION: 1. No evidence of acute fracture or traumatic malalignment. 2. Moderate multilevel degenerative disc disease. 3. Approximately 1.5 cm right thyroid nodule. Recommend thyroid ultrasound (ref: J Am Coll Radiol. 2015 Feb;12(2): 143-50).   Electronically Signed By: Margaretha Sheffield M.D. On: 03/24/2022 15:49   Narrative CLINICAL DATA:  Back pain with a possible radicular component, with bilateral knee pain left shoulder pain.  EXAM: LUMBAR SPINE - COMPLETE WITH BENDING VIEWS; RIGHT KNEE - 1-2 VIEW; LEFT SHOULDER - 2+ VIEW; LEFT KNEE - 1-2 VIEW  COMPARISON:  Only comparison study available to the imaged body parts today, is a right knee series dated 03/24/2022  FINDINGS: Left shoulder, three views:  A left chest dual lead pacing system with AID wiring superimposes over the field on the first image.  Osteopenia. There is no evidence of fracture or dislocation. There is slight spurring of the [facility] joint and near bone-on-bone joint space loss of the glenohumeral joint with humeral head and glenoid osteophytes.  There is  preservation of the normal acromiohumeral space. There is cardiomegaly and aortic atherosclerosis. Moderate thoracic dextroscoliosis incidentally seen.  AP Lat both obliques, and lateral flexion and extension lumbar spine views:  On the flexion and extension views there is 4 mm of anterolisthesis of L3 on 4 which is only seen in flexion, disappears on the neutral and extension views. There is 6 mm grade 1 anterolisthesis of L4 on 5 which is stable in all 3 positions.  No other AP subluxation is seen but there is a moderate levorotary scoliosis noted with the apex at L2-3.  There is normal lumbar segmentation. Osteopenia is noted.  There is moderate facet hypertrophy on the right-greater-than-left from L2-3 inferiorly, most pronounced on the right-greater-than-left at L4-5 and L5-S1.  The oblique views do appear to show at least moderate and possibly moderate to severe foraminal stenosis at the lowest 2 levels on the right. Foraminal stenosis is not definitively seen on the left.  There is no evidence of fractures or compression deformities. There is mild-to-moderate disc space loss T12-L1 L1-2 L3-4, L4-5 and L5-S1 with normal disc height only at L2-3, and mild lumbar spondylosis.  There are gonadal vein phleboliths on the left at L5. There are pelvic floor phleboliths. Trace aortic atherosclerosis.  The visualized pelvis is intact.  The SI joints are both patent.  Right knee:  Again noted is a small suprapatellar bursal effusion unchanged.  Again also noted is a 2.3 x 1.0 x 1.1 cm popcorn like calcific body in the posterior calf soft tissues medially which is unchanged and most likely due to dystrophic calcification such as prior myositis ossificans.  The soft tissues are otherwise unremarkable. There is enthesopathic spurring of the anterior superior patella.  Tricompartmental joint space loss most significant in the medial femorotibial and patellofemoral joints is again  noted with tricompartmental marginal osteophytes.  No loose body is seen. There is osteopenia without evidence of fractures. Enthesopathic spurring anterior superior patella.  Comparison to the prior study reveals no significant interval change.  Left knee:  There is a minimal suprapatellar bursal effusion. Tricompartmental partial joint space loss is noted similar to the right side with mild tricompartmental marginal osteophytosis.  Enthesopathic spurring similar to the right side is also seen of the anterior superior patella.  New loose body is evident. There is osteopenia without evidence of fractures. Superficial soft tissues are unremarkable.  IMPRESSION: 1. Left glenohumeral DJD without erosive arthropathy, fractures or loose bodies. Trace AC joint spurring. Osteopenia. 2. Aortic atherosclerosis.  Cardiomegaly.  Pacemaker/AID. 3. Moderate lumbar levorotary scoliosis with right-greater-than-left facet hypertrophy progressively from L2-3 caudad, with right-sided foraminal stenosis of the lowest 2 levels. 4. 4 mm grade 1 L3-4 spondylolisthesis only seen in flexion. 5. Stable 6 mm grade 1 L4-5 spondylolisthesis in all 3 positions. 6. Degenerative disc disease and spondylosis. 7. Degenerative and enthesopathic changes of the knees with small right and minimal left suprapatellar bursal effusions. No erosive arthropathy or fractures. Osteopenia.   Electronically Signed By: Telford Nab M.D. On: 06/11/2022 22:11 Narrative CLINICAL DATA:  Back pain with a possible radicular component, with bilateral knee pain left shoulder pain.  EXAM: LUMBAR SPINE - COMPLETE WITH BENDING VIEWS; RIGHT KNEE - 1-2 VIEW; LEFT SHOULDER - 2+ VIEW; LEFT KNEE - 1-2 VIEW  COMPARISON:  Only comparison study available to the imaged body parts today, is a right knee series dated 03/24/2022  FINDINGS: Left shoulder, three views:  A left chest dual lead pacing system with AID wiring  superimposes over the field on the first image.  Osteopenia. There is no evidence of fracture or dislocation. There is slight spurring of the [facility] joint and near bone-on-bone joint space loss of the glenohumeral joint with humeral head and glenoid osteophytes.  There is preservation of the normal acromiohumeral space. There is cardiomegaly and aortic atherosclerosis. Moderate thoracic dextroscoliosis incidentally seen.  AP Lat both obliques, and lateral flexion and extension lumbar spine views:  On the flexion and extension views there is 4 mm of anterolisthesis of L3 on 4 which is only seen in flexion, disappears on the neutral and extension views. There is 6 mm  grade 1 anterolisthesis of L4 on 5 which is stable in all 3 positions.  No other AP subluxation is seen but there is a moderate levorotary scoliosis noted with the apex at L2-3.  There is normal lumbar segmentation. Osteopenia is noted. There is moderate facet hypertrophy on the right-greater-than-left from L2-3 inferiorly, most pronounced on the right-greater-than-left at L4-5 and L5-S1.  The oblique views do appear to show at least moderate and possibly moderate to severe foraminal stenosis at the lowest 2 levels on the right. Foraminal stenosis is not definitively seen on the left.  There is no evidence of fractures or compression deformities. There is mild-to-moderate disc space loss T12-L1 L1-2 L3-4, L4-5 and L5-S1 with normal disc height only at L2-3, and mild lumbar spondylosis.  There are gonadal vein phleboliths on the left at L5. There are pelvic floor phleboliths. Trace aortic atherosclerosis.  The visualized pelvis is intact.  The SI joints are both patent.  Right knee:  Again noted is a small suprapatellar bursal effusion unchanged.  Again also noted is a 2.3 x 1.0 x 1.1 cm popcorn like calcific body in the posterior calf soft tissues medially which is unchanged and most likely due to dystrophic  calcification such as prior myositis ossificans.  The soft tissues are otherwise unremarkable. There is enthesopathic spurring of the anterior superior patella.  Tricompartmental joint space loss most significant in the medial femorotibial and patellofemoral joints is again noted with tricompartmental marginal osteophytes.  No loose body is seen. There is osteopenia without evidence of fractures. Enthesopathic spurring anterior superior patella.  Comparison to the prior study reveals no significant interval change.  Left knee:  There is a minimal suprapatellar bursal effusion. Tricompartmental partial joint space loss is noted similar to the right side with mild tricompartmental marginal osteophytosis.  Enthesopathic spurring similar to the right side is also seen of the anterior superior patella.  New loose body is evident. There is osteopenia without evidence of fractures. Superficial soft tissues are unremarkable.  IMPRESSION: 1. Left glenohumeral DJD without erosive arthropathy, fractures or loose bodies. Trace AC joint spurring. Osteopenia. 2. Aortic atherosclerosis.  Cardiomegaly.  Pacemaker/AID. 3. Moderate lumbar levorotary scoliosis with right-greater-than-left facet hypertrophy progressively from L2-3 caudad, with right-sided foraminal stenosis of the lowest 2 levels. 4. 4 mm grade 1 L3-4 spondylolisthesis only seen in flexion. 5. Stable 6 mm grade 1 L4-5 spondylolisthesis in all 3 positions. 6. Degenerative disc disease and spondylosis. 7. Degenerative and enthesopathic changes of the knees with small right and minimal left suprapatellar bursal effusions. No erosive arthropathy or fractures. Osteopenia.   Electronically Signed By: Telford Nab M.D. On: 06/11/2022 22:11 Narrative CLINICAL DATA:  Fall, pain.  EXAM: RIGHT KNEE - COMPLETE 4+ VIEW  COMPARISON:  None.  FINDINGS: No evidence of fracture, dislocation, or joint effusion. Tricompartmental  joint space narrowing prominent in the medial tibiofemoral and patellofemoral compartments with marginal osteophytes. Small suprapatellar joint effusion. Heterotopic soft tissue ossification, likely sequela of prior trauma.  IMPRESSION: 1. No evidence of acute fracture or dislocation. 2. Mild-to-moderate tricompartmental osteoarthritis. 3. 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   Electronically Signed By: Keane Police D.O. On: 03/24/2022 17:19  Complexity Note: Imaging results reviewed. Results shared with Valerie Munoz, using [employer].                         Meds   Current Outpatient Medications:  carvedilol (COREG) 25 MG tablet, Take 25 mg by mouth 2 (two) times daily., Disp: , Rfl:    cetirizine (ZYRTEC) 10 MG tablet, Take 1 tablet (10 mg total) by mouth daily., Disp: 60 tablet, Rfl: 1   DULoxetine (CYMBALTA) 30 MG capsule, Take 30 mg by mouth daily., Disp: , Rfl:    folic acid (FOLVITE) 1 MG tablet, Take 1 mg by mouth daily., Disp: , Rfl:    furosemide (LASIX) 20 MG tablet, Take 20 mg by mouth every other day., Disp: , Rfl:    losartan (COZAAR) 50 MG tablet, Take 50 mg by mouth daily., Disp: , Rfl:    Misc. Devices (ADJUST BATH/SHOWER SEAT) MISC, 1 each by Does not apply route daily as needed., Disp: 1 each, Rfl: 0   Misc. Devices (WALL GRAB BAR) MISC, 1 each by Does not apply route as directed., Disp: 1 each, Rfl: 0   multivitamin (RENA-VIT) TABS tablet, Take 1 tablet by mouth daily., Disp: , Rfl:    OXcarbazepine (TRILEPTAL) 150 MG tablet, Take 150 mg by mouth daily., Disp: , Rfl:    triamcinolone cream (KENALOG) 0.1 %, Apply 1 application. topically 2 (two) times daily., Disp: 30 g, Rfl: 0  ROS  Constitutional: Denies any fever or chills Gastrointestinal: No reported hemesis, hematochezia, vomiting, or acute GI distress Musculoskeletal: Denies any acute onset joint swelling, redness,  loss of ROM, or weakness Neurological: No reported episodes of acute onset apraxia, aphasia, dysarthria, agnosia, amnesia, paralysis, loss of coordination, or loss of consciousness  Allergies  Valerie Munoz has No Known Allergies.  [place]  Drug: Valerie Munoz  reports no history of drug use. Alcohol:  reports that she does not currently use alcohol. Tobacco:  reports that she has never smoked. She has never used smokeless tobacco. Medical:  has a past medical history of Gout, Heart disease, Hypertension, Migraine headache, and Neuropathy. Surgical: Valerie Munoz  has a past surgical history that includes eye tumor removal (1977) and Abdominal hysterectomy. Family: family history is not on file.  Constitutional Exam  General appearance: Well nourished, well developed, and well hydrated. In no apparent acute distress Vitals:   07/05/22 1353  BP: (!) 131/91  Pulse: 64  Resp: 16  Temp: (!) 96.9 F (36.1 C)  TempSrc: Temporal  SpO2: 98%  Weight: 191 lb (86.6 kg)  Height: 5' 4"  (1.626 m)   BMI Assessment: Estimated body mass index is 32.79 kg/m as calculated from the following:   Height as of this encounter: 5' 4"  (1.626 m).   Weight as of this encounter: 191 lb (86.6 kg).  BMI interpretation table: BMI level Category Range association with higher incidence of chronic pain  <18 kg/m2 Underweight   18.5-24.9 kg/m2 Ideal body weight   25-29.9 kg/m2 Overweight Increased incidence by 20%  30-34.9 kg/m2 Obese (Class I) Increased incidence by 68%  35-39.9 kg/m2 Severe obesity (Class II) Increased incidence by 136%  >40 kg/m2 Extreme obesity (Class III) Increased incidence by 254%   Patient's current BMI Ideal Body weight  Body mass index is 32.79 kg/m. Ideal body weight: 54.7 kg (120 lb 9.5 oz) Adjusted ideal body weight: 67.5 kg (148 lb 12.1 oz)   BMI Readings from Last 4 Encounters:  07/05/22 32.79 kg/m  06/09/22 32.79 kg/m  05/20/22 32.85 kg/m  04/27/22 32.85 kg/m   Wt Readings  from Last 4 Encounters:  07/05/22 191 lb (86.6 kg)  06/09/22 191 lb (86.6 kg)  04/27/22 191 lb 6.4 oz (86.8 kg)  03/30/22  189 lb 3.2 oz (85.8 kg)    Psych/Mental status: Alert, oriented x 3 (person, place, & time)       Eyes: PERLA Respiratory: No evidence of acute respiratory distress  Cervical Spine Area Exam  Skin & Axial Inspection: No masses, redness, edema, swelling, or associated skin lesions Alignment: Symmetrical Functional ROM: Unrestricted ROM      Stability: No instability detected Muscle Tone/Strength: Functionally intact. No obvious neuro-muscular anomalies detected. Sensory (Neurological): Unimpaired Palpation: No palpable anomalies           Upper Extremity (UE) Exam      Side: Right upper extremity   Side: Left upper extremity  Skin & Extremity Inspection: Skin color, temperature, and hair growth are WNL. No peripheral edema or cyanosis. No masses, redness, swelling, asymmetry, or associated skin lesions. No contractures.   Skin & Extremity Inspection: Skin color, temperature, and hair growth are WNL. No peripheral edema or cyanosis. No masses, redness, swelling, asymmetry, or associated skin lesions. No contractures.  Functional ROM: Unrestricted ROM           Functional ROM: Pain restricted ROM for shoulder  Muscle Tone/Strength: Functionally intact. No obvious neuro-muscular anomalies detected.   Muscle Tone/Strength: Functionally intact. No obvious neuro-muscular anomalies detected.  Sensory (Neurological): Unimpaired           Sensory (Neurological): Musculoskeletal pain pattern          Palpation: No palpable anomalies               Palpation: No palpable anomalies              Provocative Test(s):  Phalen's test: deferred Tinel's test: deferred Apley's scratch test (touch opposite shoulder):  Action 1 (Across chest): deferred Action 2 (Overhead): deferred Action 3 (LB reach): deferred     Provocative Test(s):  Phalen's test: deferred Tinel's test:  deferred Apley's scratch test (touch opposite shoulder):  Action 1 (Across chest): Decreased ROM Action 2 (Overhead): Decreased ROM Action 3 (LB reach): Decreased ROM      Thoracic Spine Area Exam  Skin & Axial Inspection: No masses, redness, or swelling Alignment: Symmetrical Functional ROM: Unrestricted ROM Stability: No instability detected Muscle Tone/Strength: Functionally intact. No obvious neuro-muscular anomalies detected. Sensory (Neurological): Unimpaired Muscle strength & Tone: No palpable anomalies    Lumbar Spine Area Exam  Skin & Axial Inspection: No masses, redness, or swelling Alignment: Symmetrical Functional ROM: Pain restricted ROM       Stability: No instability detected Muscle Tone/Strength: Functionally intact. No obvious neuro-muscular anomalies detected. Sensory (Neurological): Musculoskeletal pain pattern Palpation: No palpable anomalies       Provocative Tests: Hyperextension/rotation test: (+) bilaterally for facet joint pain.   Gait & Posture Assessment  Ambulation: Unassisted Gait: Relatively normal for age and body habitus Posture: WNL    Lower Extremity Exam      Side: Right lower extremity   Side: Left lower extremity  Stability: No instability observed           Stability: No instability observed          Skin & Extremity Inspection: Skin color, temperature, and hair growth are WNL. No peripheral edema or cyanosis. No masses, redness, swelling, asymmetry, or associated skin lesions. No contractures.   Skin & Extremity Inspection: Skin color, temperature, and hair growth are WNL. No peripheral edema or cyanosis. No masses, redness, swelling, asymmetry, or associated skin lesions. No contractures.  Functional ROM: Pain restricted ROM for knee joint  Functional ROM: Pain restricted ROM for knee joint          Muscle Tone/Strength: Functionally intact. No obvious neuro-muscular anomalies detected.   Muscle Tone/Strength: Functionally  intact. No obvious neuro-muscular anomalies detected.  Sensory (Neurological): Neuropathic pain pattern      and arthropathic   Sensory (Neurological): Neuropathic pain pattern      and arthropathic  DTR: Patellar: deferred today Achilles: deferred today Plantar: deferred today   DTR: Patellar: deferred today Achilles: deferred today Plantar: deferred today  Palpation: No palpable anomalies   Palpation: No palpable anomalies     Assessment & Plan  Primary Diagnosis & Pertinent Problem List: The primary encounter diagnosis was Arthritis of left glenohumeral joint. Diagnoses of Chronic left shoulder pain, Chronic pain of both knees, Bilateral post-traumatic osteoarthritis of knee, Low vitamin D level, and Chronic pain syndrome were also pertinent to this visit.  Visit Diagnosis: 1. Arthritis of left glenohumeral joint   2. Chronic left shoulder pain   3. Chronic pain of both knees   4. Bilateral post-traumatic osteoarthritis of knee   5. Low vitamin D level   6. Chronic pain syndrome    Problems updated and reviewed during this visit: Problem  Bilateral Post-Traumatic Osteoarthritis of Knee  Arthritis of Left Glenohumeral Joint    Plan of Care    Procedure Orders         SHOULDER INJECTION         KNEE INJECTION     1. Arthritis of left glenohumeral joint - SHOULDER INJECTION; Future  2. Chronic left shoulder pain - SHOULDER INJECTION; Future  3. Chronic pain of both knees - KNEE INJECTION; Future  4. Bilateral post-traumatic osteoarthritis of knee - KNEE INJECTION; Future  5. Low vitamin D level  6. Chronic pain syndrome - SHOULDER INJECTION; Future - KNEE INJECTION; Future    Provider-requested follow-up: Return in about 2 weeks (around 07/19/2022) for Left shoulder injection (anterior) + B/L knee steroid , in clinic (PO Valium). I spent a total of 40 minutes reviewing chart data, face-to-face evaluation with the patient, counseling and coordination of care as  detailed above.   Recent Visits Date Type Provider Dept  06/09/22 Office Visit Gillis Santa, MD [facility]  Showing recent visits within past 90 days and meeting all other requirements Today's Visits Date Type Provider Dept  07/05/22 Office Visit Gillis Santa, MD [facility]  Showing today's visits and meeting all other requirements Future Appointments No visits were found meeting these conditions. Showing future appointments within next 90 days and meeting all other requirements  Primary Care Physician: Cletis Athens, MD Note by: Gillis Santa, MD Date: 07/05/2022; Time: 2:20 PM

## 2022-07-05 NOTE — Progress Notes (Signed)
Safety precautions to be maintained throughout the outpatient stay will include: orient to surroundings, keep bed in low position, maintain call bell within reach at all times, provide assistance with transfer out of bed and ambulation.  

## 2022-07-05 NOTE — Patient Instructions (Signed)

## 2022-07-17 ENCOUNTER — Other Ambulatory Visit: Payer: Self-pay | Admitting: Internal Medicine

## 2022-07-19 ENCOUNTER — Ambulatory Visit
Admission: RE | Admit: 2022-07-19 | Discharge: 2022-07-19 | Disposition: A | Discharge status: Home / Self Care | Payer: Medicaid Other | Source: Ambulatory Visit | Attending: Student in an Organized Health Care Education/Training Program | Admitting: Student in an Organized Health Care Education/Training Program

## 2022-07-19 ENCOUNTER — Ambulatory Visit
Payer: Medicare Other | Attending: Student in an Organized Health Care Education/Training Program | Admitting: Student in an Organized Health Care Education/Training Program

## 2022-07-19 ENCOUNTER — Encounter: Payer: Self-pay | Admitting: Student in an Organized Health Care Education/Training Program

## 2022-07-19 VITALS — BP 135/80 | HR 92 | Temp 97.1°F | Resp 16 | Ht 64.0 in | Wt 191.0 lb

## 2022-07-19 DIAGNOSIS — G8929 Other chronic pain: Secondary | ICD-10-CM | POA: Insufficient documentation

## 2022-07-19 DIAGNOSIS — M25512 Pain in left shoulder: Secondary | ICD-10-CM | POA: Diagnosis not present

## 2022-07-19 DIAGNOSIS — M19012 Primary osteoarthritis, left shoulder: Secondary | ICD-10-CM | POA: Insufficient documentation

## 2022-07-19 DIAGNOSIS — G894 Chronic pain syndrome: Secondary | ICD-10-CM | POA: Insufficient documentation

## 2022-07-19 DIAGNOSIS — M25562 Pain in left knee: Secondary | ICD-10-CM | POA: Insufficient documentation

## 2022-07-19 DIAGNOSIS — M172 Bilateral post-traumatic osteoarthritis of knee: Secondary | ICD-10-CM | POA: Diagnosis not present

## 2022-07-19 DIAGNOSIS — M25561 Pain in right knee: Secondary | ICD-10-CM | POA: Insufficient documentation

## 2022-07-19 MED ORDER — ROPIVACAINE HCL 2 MG/ML IJ SOLN
9.0000 mL | Freq: Once | INTRAMUSCULAR | Status: AC
  Administered 2022-07-19: 9 mL via PERINEURAL

## 2022-07-19 MED ORDER — ROPIVACAINE HCL 2 MG/ML IJ SOLN
4.0000 mL | Freq: Once | INTRAMUSCULAR | Status: AC
Start: 2022-07-19 — End: 2022-07-19
  Administered 2022-07-19: 20 mL via PERINEURAL

## 2022-07-19 MED ORDER — LIDOCAINE HCL 2 % IJ SOLN
20.0000 mL | Freq: Once | INTRAMUSCULAR | Status: AC
Start: 2022-07-19 — End: 2022-07-19
  Administered 2022-07-19: 100 mg

## 2022-07-19 MED ORDER — DIAZEPAM 5 MG PO TABS
ORAL_TABLET | ORAL | Status: AC
  Filled 2022-07-19: qty 1

## 2022-07-19 MED ORDER — ROPIVACAINE HCL 2 MG/ML IJ SOLN
INTRAMUSCULAR | Status: AC
  Filled 2022-07-19: qty 20

## 2022-07-19 MED ORDER — METHYLPREDNISOLONE ACETATE 80 MG/ML IJ SUSP
80.0000 mg | Freq: Once | INTRAMUSCULAR | Status: AC
Start: 2022-07-19 — End: 2022-07-19
  Administered 2022-07-19: 80 mg via INTRA_ARTICULAR

## 2022-07-19 MED ORDER — LIDOCAINE HCL (PF) 2 % IJ SOLN
INTRAMUSCULAR | Status: AC
  Filled 2022-07-19: qty 10

## 2022-07-19 MED ORDER — METHYLPREDNISOLONE ACETATE 80 MG/ML IJ SUSP
INTRAMUSCULAR | Status: AC
  Filled 2022-07-19: qty 1

## 2022-07-19 MED ORDER — DIAZEPAM 5 MG PO TABS
5.0000 mg | ORAL_TABLET | ORAL | Status: AC
  Administered 2022-07-19: 5 mg via ORAL

## 2022-07-19 MED ORDER — IOHEXOL 180 MG/ML  SOLN
INTRAMUSCULAR | Status: AC
  Filled 2022-07-19: qty 20

## 2022-07-19 MED ORDER — DEXAMETHASONE SODIUM PHOSPHATE 10 MG/ML IJ SOLN
10.0000 mg | Freq: Once | INTRAMUSCULAR | Status: AC
Start: 2022-07-19 — End: 2022-07-19
  Administered 2022-07-19: 10 mg

## 2022-07-19 MED ORDER — DEXAMETHASONE SODIUM PHOSPHATE 10 MG/ML IJ SOLN
INTRAMUSCULAR | Status: AC
  Filled 2022-07-19: qty 1

## 2022-07-19 MED ORDER — IOHEXOL 180 MG/ML  SOLN
10.0000 mL | Freq: Once | INTRAMUSCULAR | Status: AC
Start: 2022-07-19 — End: 2022-07-19
  Administered 2022-07-19: 10 mL via INTRA_ARTICULAR

## 2022-07-19 NOTE — Patient Instructions (Signed)

## 2022-07-19 NOTE — Progress Notes (Signed)
Safety precautions to be maintained throughout the outpatient stay will include: orient to surroundings, keep bed in low position, maintain call bell within reach at all times, provide assistance with transfer out of bed and ambulation.  

## 2022-07-19 NOTE — Progress Notes (Signed)
PROVIDER NOTE: Interpretation of information contained herein should be left to medically-trained personnel. Specific patient instructions are provided elsewhere under "Patient Instructions" section of medical record. This document was created in part using STT-dictation technology, any transcriptional errors that may result from this process are unintentional.  Patient: Valerie Munoz Type: Established DOB: [birth year] MRN: [id number] PCP: Cletis Athens, MD  Service: Procedure DOS: 07/19/2022 Setting: Ambulatory Location: Ambulatory outpatient facility Delivery: Face-to-face Provider: Gillis Santa, MD Specialty: Interventional Pain Management Specialty designation: 09 Location: Outpatient facility Ref. Prov.: Cletis Athens, MD    Interventional Therapy    Procedure: Glenohumeral Joint (shoulder) Injection #1  Laterality: Left (-LT)  Level: Shoulder   Imaging: Fluoroscopy-guided         Anesthesia: Local anesthesia (1-2% Lidocaine)  Sedation: Minimal Sedation Valium 5 mg p.o                .  DOS: 07/19/2022  Performed by: Gillis Santa, MD  Purpose: Diagnostic/Therapeutic Indications: Shoulder pain severe enough to impact quality of life or function. Rationale (medical necessity): procedure needed and proper for the diagnosis and/or treatment of Valerie Munoz's medical symptoms and needs. 1. Arthritis of left glenohumeral joint   2. Chronic left shoulder pain   3. Chronic pain of both knees   4. Bilateral post-traumatic osteoarthritis of knee   5. Chronic pain syndrome    NAS-11 Pain score:   Pre-procedure: 8 /10   Post-procedure: 7 /10      Target: Glenohumeral Joint (shoulder) Location: Intra-articular  Region: Entire Shoulder Area Approach: Anterolateral approach. Type of procedure: Percutaneous joint injection   Position  Prep  Materials:  Position: Supine Prep solution: DuraPrep (Iodine Povacrylex [0.7% available iodine] and Isopropyl Alcohol, 74% w/w) Prep Area:  Entire shoulder Area Materials:  Tray: Block Needle(s):  Type: Spinal  Gauge (G): 22  Length: 3.5-in  Qty: 1  Pre-op H&P Assessment:  Valerie Munoz is a 71 y.o. (year old), female patient, seen today for interventional treatment. She  has a past surgical history that includes eye tumor removal (1977) and Abdominal hysterectomy. Valerie Munoz has a current medication list which includes the following prescription(s): carvedilol, cetirizine, duloxetine, folic acid, furosemide, losartan, adjust bath/shower seat, wall grab bar, multivitamin, oxcarbazepine, and triamcinolone cream. Her primarily concern today is the Shoulder Pain (left) and Knee Pain (bilat)  Initial Vital Signs:  Pulse/HCG Rate: 92 ECG Heart Rate: (!) 102 Temp: (!) 97.1 F (36.2 C) Resp: 16 BP: (!) 127/95 SpO2: 96 %  BMI: Estimated body mass index is 32.79 kg/m as calculated from the following:   Height as of this encounter: '5\' 4"'$  (1.626 m).   Weight as of this encounter: 191 lb (86.6 kg).  Risk Assessment: Allergies: Reviewed. She has No Known Allergies.  Allergy Precautions: None required Coagulopathies: Reviewed. None identified.  Blood-thinner therapy: None at this time Active Infection(s): Reviewed. None identified. Valerie Munoz is afebrile  Site Confirmation: Valerie Munoz was asked to confirm the procedure and laterality before marking the site Procedure checklist: Completed Consent: Before the procedure and under the influence of no sedative(s), amnesic(s), or anxiolytics, the patient was informed of the treatment options, risks and possible complications. To fulfill our ethical and legal obligations, as recommended by the American Medical Association's Code of Ethics, I have informed the patient of my clinical impression; the nature and purpose of the treatment or procedure; the risks, benefits, and possible complications of the intervention; the alternatives, including doing nothing; the risk(s) and benefit(s) of the  alternative  treatment(s) or procedure(s); and the risk(s) and benefit(s) of doing nothing. The patient was provided information about the general risks and possible complications associated with the procedure. These may include, but are not limited to: failure to achieve desired goals, infection, bleeding, organ or nerve damage, allergic reactions, paralysis, and death. In addition, the patient was informed of those risks and complications associated to the procedure, such as failure to decrease pain; infection; bleeding; organ or nerve damage with subsequent damage to sensory, motor, and/or autonomic systems, resulting in permanent pain, numbness, and/or weakness of one or several areas of the body; allergic reactions; (i.e.: anaphylactic reaction); and/or death. Furthermore, the patient was informed of those risks and complications associated with the medications. These include, but are not limited to: allergic reactions (i.e.: anaphylactic or anaphylactoid reaction(s)); adrenal axis suppression; blood sugar elevation that in diabetics may result in ketoacidosis or comma; water retention that in patients with history of congestive heart failure may result in shortness of breath, pulmonary edema, and decompensation with resultant heart failure; weight gain; swelling or edema; medication-induced neural toxicity; particulate matter embolism and blood vessel occlusion with resultant organ, and/or nervous system infarction; and/or aseptic necrosis of one or more joints. Finally, the patient was informed that Medicine is not an exact science; therefore, there is also the possibility of unforeseen or unpredictable risks and/or possible complications that may result in a catastrophic outcome. The patient indicated having understood very clearly. We have given the patient no guarantees and we have made no promises. Enough time was given to the patient to ask questions, all of which were answered to the patient's  satisfaction. Valerie Munoz has indicated that she wanted to continue with the procedure. Attestation: I, the ordering provider, attest that I have discussed with the patient the benefits, risks, side-effects, alternatives, likelihood of achieving goals, and potential problems during recovery for the procedure that I have provided informed consent. Date  Time: 07/19/2022 10:11 AM   Imaging Guidance (Non-Spinal):          Type of Imaging Technique: Fluoroscopy Guidance (Non-Spinal) Indication(s): Assistance in needle guidance and placement for procedures requiring needle placement in or near specific anatomical locations not easily accessible without such assistance. Exposure Time: Please see nurses notes. Contrast: Before injecting any contrast, we confirmed that the patient did not have an allergy to iodine, shellfish, or radiological contrast. Once satisfactory needle placement was completed at the desired level, radiological contrast was injected. Contrast injected under live fluoroscopy. No contrast complications. See chart for type and volume of contrast used. Fluoroscopic Guidance: I was personally present during the use of fluoroscopy. "Tunnel Vision Technique" used to obtain the best possible view of the target area. Parallax error corrected before commencing the procedure. "Direction-depth-direction" technique used to introduce the needle under continuous pulsed fluoroscopy. Once target was reached, antero-posterior, oblique, and lateral fluoroscopic projection used confirm needle placement in all planes. Images permanently stored in EMR. Interpretation: I personally interpreted the imaging intraoperatively. Adequate needle placement confirmed in multiple planes. Appropriate spread of contrast into desired area was observed. No evidence of afferent or efferent intravascular uptake. Permanent images saved into the patient's record.  Pre-Procedure Preparation:  Monitoring: As per clinic protocol.  Respiration, ETCO2, SpO2, BP, heart rate and rhythm monitor placed and checked for adequate function Safety Precautions: Patient was assessed for positional comfort and pressure points before starting the procedure. Time-out: I initiated and conducted the "Time-out" before starting the procedure, as per protocol. The patient was asked to participate  by confirming the accuracy of the "Time Out" information. Verification of the correct person, site, and procedure were performed and confirmed by me, the nursing staff, and the patient. "Time-out" conducted as per Joint Commission's Universal Protocol (UP.01.01.01). Time: 1046  Description  Narrative of Procedure:          Rationale (medical necessity): procedure needed and proper for the diagnosis and/or treatment of the patient's medical symptoms and needs. Procedural Technique Safety Precautions: Aspiration looking for blood return was conducted prior to all injections. At no point did we inject any substances, as a needle was being advanced. No attempts were made at seeking any paresthesias. Safe injection practices and needle disposal techniques used. Medications properly checked for expiration dates. SDV (single dose vial) medications used. Description of the Procedure: Protocol guidelines were followed. The patient was placed in position over the procedure table. The target area was identified and the area prepped in the usual manner. Skin & deeper tissues infiltrated with local anesthetic. Appropriate amount of time allowed to pass for local anesthetics to take effect. The procedure needles were then advanced to the target area. Proper needle placement secured. Negative aspiration confirmed. Solution injected in intermittent fashion, asking for systemic symptoms every 0.5cc of injectate. The needles were then removed and the area cleansed, making sure to leave some of the prepping solution back to take advantage of its long term bactericidal  properties.  5 cc solution made of 4 cc of 0.2% ropivacaine, 1 cc of Decadron 10 mg/cc injected in the left glenohumeral joint after contrast confirmation.              Vitals:   07/19/22 1022 07/19/22 1054 07/19/22 1101  BP: (!) 127/95 (!) 136/90 135/80  Pulse: 92    Resp: '16 18 16  '$ Temp: (!) 97.1 F (36.2 C)    TempSrc: Temporal    SpO2: 96% 98% 99%  Weight: 191 lb (86.6 kg)    Height: '5\' 4"'$  (1.626 m)       Start Time: 1046 hrs. End Time: 1048 hrs.  Antibiotic Prophylaxis:   Anti-infectives (From admission, onward)    None      Indication(s): None identified  Post-operative Assessment:  Post-procedure Vital Signs:  Pulse/HCG Rate: 9292 Temp:  (!) 97.1 F (36.2 C) Resp: 16 BP: 135/80 SpO2: 99 %  EBL: None  Complications: No immediate post-treatment complications observed by team, or reported by patient.  Note: The patient tolerated the entire procedure well. A repeat set of vitals were taken after the procedure and the patient was kept under observation following institutional policy, for this type of procedure. Post-procedural neurological assessment was performed, showing return to baseline, prior to discharge. The patient was provided with post-procedure discharge instructions, including a section on how to identify potential problems. Should any problems arise concerning this procedure, the patient was given instructions to immediately contact us, at any time, without hesitation. In any case, we plan to contact the patient by telephone for a follow-up status report regarding this interventional procedure.  Comments:  No additional relevant information.  Plan of Care  Orders:  Orders Placed This Encounter  Procedures   DG PAIN CLINIC C-ARM 1-60 MIN NO REPORT    Intraoperative interpretation by procedural physician at [facility].    Standing Status:   Standing    Number of Occurrences:   1    Order Specific Question:   Reason for exam:     Answer:   Assistance in needle  guidance and placement for procedures requiring needle placement in or near specific anatomical locations not easily accessible without such assistance.     Medications ordered for procedure: Meds ordered this encounter  Medications   iohexol (OMNIPAQUE) 180 MG/ML injection 10 mL    Must be Myelogram-compatible. If not available, you may substitute with a water-soluble, non-ionic, hypoallergenic, myelogram-compatible radiological contrast medium.   lidocaine (XYLOCAINE) 2 % (with pres) injection 400 mg   methylPREDNISolone acetate (DEPO-MEDROL) injection 80 mg   ropivacaine (PF) 2 mg/mL (0.2%) (NAROPIN) injection 9 mL   dexamethasone (DECADRON) injection 10 mg   ropivacaine (PF) 2 mg/mL (0.2%) (NAROPIN) injection 4 mL   diazepam (VALIUM) tablet 5 mg    Make sure Flumazenil is available in the pyxis when using this medication. If oversedation occurs, administer 0.2 mg IV over 15 sec. If after 45 sec no response, administer 0.2 mg again over 1 min; may repeat at 1 min intervals; not to exceed 4 doses (1 mg)   Medications administered: We administered iohexol, lidocaine, methylPREDNISolone acetate, ropivacaine (PF) 2 mg/mL (0.2%), dexamethasone, ropivacaine (PF) 2 mg/mL (0.2%), and diazepam.  See the medical record for exact dosing, route, and time of administration.  Follow-up plan:   Return in about 3 weeks (around 08/09/2022) for [id number].      Recent Visits Date Type Provider Dept  07/05/22 Office Visit LGillis Santa MD [facility]  06/09/22 Office Visit LGillis Santa MD [facility]  Showing recent visits within past 90 days and meeting all other requirements Today's Visits Date Type Provider Dept  07/19/22 Procedure visit LGillis Santa MD [facility]  Showing today's visits and meeting all other requirements Future Appointments Date Type Provider Dept  08/11/22 Appointment LGillis Santa MD [facility]   Showing future appointments within next 90 days and meeting all other requirements  Disposition: Discharge home  Discharge (Date  Time): 07/19/2022; 1110 hrs.   Primary Care Physician: MCletis Athens MD [facility] Note by: BGillis Santa MD Date: 07/19/2022; Time: 1:11 PM  Disclaimer:  Medicine is not an eChief Strategy Officer The only guarantee in medicine is that nothing is guaranteed. It is important to note that the decision to proceed with this intervention was based on the information collected from the patient. The Data and conclusions were drawn from the patient's questionnaire, the interview, and the physical examination. Because the information was provided in large part by the patient, it cannot be guaranteed that it has not been purposely or unconsciously manipulated. Every effort has been made to obtain as much relevant data as possible for this evaluation. It is important to note that the conclusions that lead to this procedure are derived in large part from the available data. Always take into account that the treatment will also be dependent on availability of resources and existing treatment guidelines, considered by other Pain Management Practitioners as being common knowledge and practice, at the time of the intervention. For Medico-Legal purposes, it is also important to point out that variation in procedural techniques and pharmacological choices are the acceptable norm. The indications, contraindications, technique, and results of the above procedure should only be interpreted and judged by a Board-Certified Interventional Pain Specialist with extensive familiarity and expertise in the same exact procedure and technique.

## 2022-07-19 NOTE — Progress Notes (Signed)
PROVIDER NOTE: Interpretation of information contained herein should be left to medically-trained personnel. Specific patient instructions are provided elsewhere under "Patient Instructions" section of medical record. This document was created in part using STT-dictation technology, any transcriptional errors that may result from this process are unintentional.  Patient: Valerie Munoz Type: Established DOB: [birth year] MRN: [id number] PCP: Cletis Athens, MD  Service: Procedure DOS: 07/19/2022 Setting: Ambulatory Location: Ambulatory outpatient facility Delivery: Face-to-face Provider: Gillis Santa, MD Specialty: Interventional Pain Management Specialty designation: 09 Location: Outpatient facility Ref. Prov.: Cletis Athens, MD    Primary Reason for Visit: Interventional Pain Management Treatment. CC: Shoulder Pain (left) and Knee Pain (bilat)   Procedure:           Type: Steroid Intra-articular Knee Injection #1  Laterality: Bilateral (-50) Level/approach: Medial Imaging guidance: None required ([id number]) Anesthesia: Local anesthesia (1-2% Lidocaine) Sedation: Minimal Sedation  Valium 5 mg p.o. .  DOS: 07/19/2022  Performed by: Gillis Santa, MD  Purpose: Diagnostic/Therapeutic Indications: Knee arthralgia associated to osteoarthritis of the knee Bilateral knee osteoarthritis NAS-11 score:   Pre-procedure: 8 /10   Post-procedure: 7 /10     Pre-Procedure Preparation  Monitoring: As per clinic protocol.  Risk Assessment: Vitals:  JSE:[id number] body mass index is 32.79 kg/m as calculated from the following:   Height as of this encounter: '5\' 4"'$  (1.626 m).   Weight as of this encounter: 191 lb (86.6 kg)., Rate:92ECG Heart Rate: (!) 102, BP:(!) 127/95, Resp:16, Temp:(!) 97.1 F (36.2 C), SpO2:96 %  Allergies: She has No Known Allergies.  Precautions: No additional precautions required  Blood-thinner(s): None at this time  Coagulopathies: Reviewed. None identified.   Active  Infection(s): Reviewed. None identified. Ms. Biel is afebrile   Location setting: Exam room Position: Sitting w/ knee bent 90 degrees Safety Precautions: Patient was assessed for positional comfort and pressure points before starting the procedure. Prepping solution: DuraPrep (Iodine Povacrylex [0.7% available iodine] and Isopropyl Alcohol, 74% w/w) Prep Area: Entire knee region Approach: percutaneous, just above the tibial plateau, lateral to the infrapatellar tendon. Intended target: Intra-articular knee space Materials: Tray: Block Needle(s): Regular Qty: 1/side Length: 1.5-inch Gauge: 25G   Meds ordered this encounter  Medications   iohexol (OMNIPAQUE) 180 MG/ML injection 10 mL    Must be Myelogram-compatible. If not available, you may substitute with a water-soluble, non-ionic, hypoallergenic, myelogram-compatible radiological contrast medium.   lidocaine (XYLOCAINE) 2 % (with pres) injection 400 mg   methylPREDNISolone acetate (DEPO-MEDROL) injection 80 mg   ropivacaine (PF) 2 mg/mL (0.2%) (NAROPIN) injection 9 mL   dexamethasone (DECADRON) injection 10 mg   ropivacaine (PF) 2 mg/mL (0.2%) (NAROPIN) injection 4 mL   diazepam (VALIUM) tablet 5 mg    Make sure Flumazenil is available in the pyxis when using this medication. If oversedation occurs, administer 0.2 mg IV over 15 sec. If after 45 sec no response, administer 0.2 mg again over 1 min; may repeat at 1 min intervals; not to exceed 4 doses (1 mg)    Orders Placed This Encounter  Procedures   [facility] 1-60 MIN NO REPORT    Intraoperative interpretation by procedural physician at [facility].    Standing Status:   Standing    Number of Occurrences:   1    Order Specific Question:   Reason for exam:    Answer:   Assistance in needle guidance and placement for procedures requiring needle placement in or near specific anatomical locations not easily accessible without such assistance.  Time-out:  1046 I initiated and conducted the "Time-out" before starting the procedure, as per protocol. The patient was asked to participate by confirming the accuracy of the "Time Out" information. Verification of the correct person, site, and procedure were performed and confirmed by me, the nursing staff, and the patient. "Time-out" conducted as per Joint Commission's Universal Protocol (UP.01.01.01). Procedure checklist: Completed   H&P (Pre-op  Assessment)  Ms. Greaves is a 71 y.o. (year old), female patient, seen today for interventional treatment. She  has a past surgical history that includes eye tumor removal (1977) and Abdominal hysterectomy. Ms. Bartolotta has a current medication list which includes the following prescription(s): carvedilol, cetirizine, duloxetine, folic acid, furosemide, losartan, adjust bath/shower seat, wall grab bar, multivitamin, oxcarbazepine, and triamcinolone cream. Her primarily concern today is the Shoulder Pain (left) and Knee Pain (bilat)  She has No Known Allergies.   Last encounter: My last encounter with her was on 07/05/2022. Pertinent problems: Ms. Kelsay does not have any pertinent problems on file. Pain Assessment: Severity of Chronic pain is reported as a 8 /10. Location: Shoulder Left/denies; denies radiating pain from knees; left lower back to left hip. Onset: More than a month ago. Quality: Constant, Discomfort, Tingling, Numbness. Timing: Constant. Modifying factor(s): nothing currently. Vitals:  height is '5\' 4"'$  (1.626 m) and weight is 191 lb (86.6 kg). Her temporal temperature is 97.1 F (36.2 C) (abnormal). Her blood pressure is 135/80 and her pulse is 92. Her respiration is 16 and oxygen saturation is 99%.   Reason for encounter: "interventional pain management therapy due pain of at least four (4) weeks in duration, with failure to respond and/or inability to tolerate more conservative care.   Site Confirmation: Ms. Zou was asked to confirm the procedure and  laterality before marking the site.  Consent: Before the procedure and under the influence of no sedative(s), amnesic(s), or anxiolytics, the patient was informed of the treatment options, risks and possible complications. To fulfill our ethical and legal obligations, as recommended by the American Medical Association's Code of Ethics, I have informed the patient of my clinical impression; the nature and purpose of the treatment or procedure; the risks, benefits, and possible complications of the intervention; the alternatives, including doing nothing; the risk(s) and benefit(s) of the alternative treatment(s) or procedure(s); and the risk(s) and benefit(s) of doing nothing. The patient was provided information about the general risks and possible complications associated with the procedure. These may include, but are not limited to: failure to achieve desired goals, infection, bleeding, organ or nerve damage, allergic reactions, paralysis, and death. In addition, the patient was informed of those risks and complications associated to Spine-related procedures, such as failure to decrease pain; infection (i.e.: Meningitis, epidural or intraspinal abscess); bleeding (i.e.: epidural hematoma, subarachnoid hemorrhage, or any other type of intraspinal or peri-dural bleeding); organ or nerve damage (i.e.: Any type of peripheral nerve, nerve root, or spinal cord injury) with subsequent damage to sensory, motor, and/or autonomic systems, resulting in permanent pain, numbness, and/or weakness of one or several areas of the body; allergic reactions; (i.e.: anaphylactic reaction); and/or death. Furthermore, the patient was informed of those risks and complications associated with the medications. These include, but are not limited to: allergic reactions (i.e.: anaphylactic or anaphylactoid reaction(s)); adrenal axis suppression; blood sugar elevation that in diabetics may result in ketoacidosis or comma; water retention  that in patients with history of congestive heart failure may result in shortness of breath, pulmonary edema, and decompensation with resultant  heart failure; weight gain; swelling or edema; medication-induced neural toxicity; particulate matter embolism and blood vessel occlusion with resultant organ, and/or nervous system infarction; and/or aseptic necrosis of one or more joints. Finally, the patient was informed that Medicine is not an exact science; therefore, there is also the possibility of unforeseen or unpredictable risks and/or possible complications that may result in a catastrophic outcome. The patient indicated having understood very clearly. We have given the patient no guarantees and we have made no promises. Enough time was given to the patient to ask questions, all of which were answered to the patient's satisfaction. Ms. Lenk has indicated that she wanted to continue with the procedure. Attestation: I, the ordering provider, attest that I have discussed with the patient the benefits, risks, side-effects, alternatives, likelihood of achieving goals, and potential problems during recovery for the procedure that I have provided informed consent.  Date  Time: 07/19/2022 10:11 AM   Prophylactic antibiotics  Anti-infectives (From admission, onward)    None      Indication(s): None identified   Description of procedure   Start Time: 1046 hrs  Local Anesthesia: Once the patient was positioned, prepped, and time-out was completed. The target area was identified located. The skin was marked with an approved surgical skin marker. Once marked, the skin (epidermis, dermis, and hypodermis), and deeper tissues (fat, connective tissue and muscle) were infiltrated with a small amount of a short-acting local anesthetic, loaded on a 10cc syringe with a 25G, 1.5-in  Needle. An appropriate amount of time was allowed for local anesthetics to take effect before proceeding to the next step. Local  Anesthetic: Lidocaine 1-2% The unused portion of the local anesthetic was discarded in the proper designated containers. Safety Precautions: Aspiration looking for blood return was conducted prior to all injections. At no point did I inject any substances, as a needle was being advanced. Before injecting, the patient was told to immediately notify me if she was experiencing any new onset of "ringing in the ears, or metallic taste in the mouth". No attempts were made at seeking any paresthesias. Safe injection practices and needle disposal techniques used. Medications properly checked for expiration dates. SDV (single dose vial) medications used. After the completion of the procedure, all disposable equipment used was discarded in the proper designated medical waste containers.  Technical description: Protocol guidelines were followed. After positioning, the target area was identified and prepped in the usual manner. Skin & deeper tissues infiltrated with local anesthetic. Appropriate amount of time allowed to pass for local anesthetics to take effect. Proper needle placement secured. Once satisfactory needle placement was confirmed, I proceeded to inject the desired solution in slow, incremental fashion, intermittently assessing for discomfort or any signs of abnormal or undesired spread of substance. Once completed, the needle was removed and disposed of, as per hospital protocols. The area was cleaned, making sure to leave some of the prepping solution back to take advantage of its long term bactericidal properties.  9 cc solution made of 8 cc of 0.2% ropivacaine, 1 cc of methylprednisolone, 80 mg/cc.  4.5 cc injected in each knee.   Aspiration:  Negative          Vitals:   07/19/22 1022 07/19/22 1054 07/19/22 1101  BP: (!) 127/95 (!) 136/90 135/80  Pulse: 92    Resp: '16 18 16  '$ Temp: (!) 97.1 F (36.2 C)    TempSrc: Temporal    SpO2: 96% 98% 99%  Weight: 191 lb (86.6 kg)  Height: '5\' 4"'$   (1.626 m)      End Time: 1048 hrs   Post-op assessment  Post-procedure Vital Signs:  Pulse/HCG Rate: 9292 Temp: (!) 97.1 F (36.2 C) Resp: 16 BP: 135/80 SpO2: 99 %  EBL: None  Complications: No immediate post-treatment complications observed by team, or reported by patient.  Note: The patient tolerated the entire procedure well. A repeat set of vitals were taken after the procedure and the patient was kept under observation following institutional policy, for this type of procedure. Post-procedural neurological assessment was performed, showing return to baseline, prior to discharge. The patient was provided with post-procedure discharge instructions, including a section on how to identify potential problems. Should any problems arise concerning this procedure, the patient was given instructions to immediately contact us, at any time, without hesitation. In any case, we plan to contact the patient by telephone for a follow-up status report regarding this interventional procedure.  Comments:  No additional relevant information.   Plan of care    Medications administered: We administered iohexol, lidocaine, methylPREDNISolone acetate, ropivacaine (PF) 2 mg/mL (0.2%), dexamethasone, ropivacaine (PF) 2 mg/mL (0.2%), and diazepam.  Follow-up plan:   Return in about 3 weeks (around 08/09/2022) for f41fppe.      Bilateral intra-articular knee steroid 07/19/2022, left anterior glenohumeral joint injection 07/19/2022.   Recent Visits Date Type Provider Dept  07/05/22 Office Visit LGillis Santa MD [facility]  06/09/22 Office Visit LGillis Santa MD [facility]  Showing recent visits within past 90 days and meeting all other requirements Today's Visits Date Type Provider Dept  07/19/22 Procedure visit LGillis Santa MD [facility]  Showing today's visits and meeting all other requirements Future Appointments Date Type Provider Dept  08/11/22 Appointment  LGillis Santa MD [facility]  Showing future appointments within next 90 days and meeting all other requirements   Disposition: Discharge home  Discharge (Date  Time): 07/19/2022; 1110 hrs.   Primary Care Physician: MCletis Athens MD [facility] Note by: BGillis Santa MD Date: 07/19/2022; Time: 1:09 PM  DISCLAIMER: Medicine is not an exact science. It has no guarantees or warranties. The decision to proceed with this intervention was based on the information collected from the patient. Conclusions were drawn from the patient's questionnaire, interview, and examination. Because information was provided in large part by the patient, it cannot be guaranteed that it has not been purposely or unconsciously manipulated or altered. Every effort has been made to obtain as much accurate, relevant, available data as possible. Always take into account that the treatment will also be dependent on availability of resources and existing treatment guidelines, considered by other Pain Management Specialists as being common knowledge and practice, at the time of the intervention. It is also important to point out that variation in procedural techniques and pharmacological choices are the acceptable norm. For Medico-Legal review purposes, the indications, contraindications, technique, and results of the these procedures should only be evaluated, judged and interpreted by a Board-Certified Interventional Pain Specialist with extensive familiarity and expertise in the same exact procedure and technique.

## 2022-07-20 ENCOUNTER — Ambulatory Visit (INDEPENDENT_AMBULATORY_CARE_PROVIDER_SITE_OTHER): Payer: Medicare Other | Admitting: Internal Medicine

## 2022-07-20 ENCOUNTER — Encounter: Payer: Self-pay | Admitting: Internal Medicine

## 2022-07-20 ENCOUNTER — Telehealth: Payer: Self-pay | Admitting: *Deleted

## 2022-07-20 VITALS — BP 139/85 | HR 115 | Ht 64.0 in | Wt 177.2 lb

## 2022-07-20 DIAGNOSIS — G629 Polyneuropathy, unspecified: Secondary | ICD-10-CM

## 2022-07-20 DIAGNOSIS — L2081 Atopic neurodermatitis: Secondary | ICD-10-CM | POA: Diagnosis not present

## 2022-07-20 DIAGNOSIS — G5793 Unspecified mononeuropathy of bilateral lower limbs: Secondary | ICD-10-CM

## 2022-07-20 DIAGNOSIS — Z95810 Presence of automatic (implantable) cardiac defibrillator: Secondary | ICD-10-CM

## 2022-07-20 DIAGNOSIS — Z6832 Body mass index (BMI) 32.0-32.9, adult: Secondary | ICD-10-CM

## 2022-07-20 DIAGNOSIS — E6609 Other obesity due to excess calories: Secondary | ICD-10-CM | POA: Diagnosis not present

## 2022-07-20 MED ORDER — CARVEDILOL 25 MG PO TABS: 25.0000 mg | ORAL_TABLET | Freq: Two times a day (BID) | ORAL | 1 refills | Status: DC

## 2022-07-20 MED ORDER — DULOXETINE HCL 30 MG PO CPEP: 30.0000 mg | ORAL_CAPSULE | Freq: Every day | ORAL | 1 refills | Status: DC

## 2022-07-20 MED ORDER — FUROSEMIDE 20 MG PO TABS
20.0000 mg | ORAL_TABLET | ORAL | 1 refills | Status: AC
Start: 2022-07-20 — End: ?

## 2022-07-20 MED ORDER — TRIAMCINOLONE ACETONIDE 0.1 % EX CREA: 1.0000 | TOPICAL_CREAM | Freq: Two times a day (BID) | CUTANEOUS | 0 refills | Status: DC

## 2022-07-20 MED ORDER — OXCARBAZEPINE 150 MG PO TABS: 150.0000 mg | ORAL_TABLET | Freq: Every day | ORAL | 1 refills | Status: AC

## 2022-07-20 MED ORDER — LOSARTAN POTASSIUM 50 MG PO TABS: 50.0000 mg | ORAL_TABLET | Freq: Every day | ORAL | 1 refills | Status: AC

## 2022-07-20 NOTE — Telephone Encounter (Signed)
No problems post procedure. 

## 2022-07-20 NOTE — Progress Notes (Signed)
Established Patient Office Visit  Subjective:  Patient ID: Valerie Munoz, female    DOB: October 28, 1951  Age: 71 y.o. MRN: [id number]  CC:  Chief Complaint  Patient presents with   Rash    1 month follow up    Rash The current episode started in the past 7 days. Pertinent negatives include no congestion, cough, diarrhea, fatigue, fever, joint pain, rhinorrhea, shortness of breath or sore throat.    Valerie Munoz presents for check up  Past Medical History:  Diagnosis Date   Gout    Hospitalized for tx of Left foot 2017, per pt.   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 No pertinent family history.  Social History   Socioeconomic History   Marital status: Widowed    Spouse name: Not on file   Number of children: Not on file   Years of education: 14   Highest education level: Associate degree: academic program  Occupational History   Not on file  Tobacco Use   Smoking status: Never   Smokeless tobacco: Never  Substance and Sexual Activity   Alcohol use: Not Currently   Drug use: Never   Sexual activity: Not Currently  Other Topics Concern   Not on file  Social History Narrative   Not on file   Social Determinants of Health   Financial Resource Strain: Low Risk  (04/30/2022)   Overall Financial Resource Strain (CARDIA)    Difficulty of Paying Living Expenses: Not very hard  Food Insecurity: Food Insecurity Present (04/30/2022)   Hunger Vital Sign    Worried About Running Out of Food in the Last Year: Sometimes true    Ran Out of Food in the Last Year: Sometimes true  Transportation Needs: No Transportation Needs (04/30/2022)   PRAPARE - Hydrologist (Medical): No    Lack of Transportation (Non-Medical): No  Physical Activity: Inactive (04/30/2022)   Exercise Vital Sign     Days of Exercise per Week: 0 days    Minutes of Exercise per Session: 0 min  Stress: No Stress Concern Present (04/30/2022)   [place]    Feeling of Stress : Not at all  Social Connections: Unknown (04/30/2022)   Social Connection and Isolation Panel [NHANES]    Frequency of Communication with Friends and Family: Patient refused    Frequency of Social Gatherings with Friends and Family: Patient refused    Attends Religious Services: Patient refused    Active Member of Clubs or Organizations: Patient refused    Attends Archivist Meetings: Patient refused    Marital Status: Widowed  Intimate Partner Violence: Not At Risk (04/30/2022)   Humiliation, Afraid, Rape, and Kick questionnaire    Fear of Current or Ex-Partner: No    Emotionally Abused: No    Physically Abused: No    Sexually Abused: No     Current Outpatient Medications:    carvedilol (COREG) 25 MG tablet, Take 1 tablet (25 mg total) by mouth 2 (two) times daily., Disp: 180 tablet, Rfl: 1   cetirizine (ZYRTEC) 10 MG tablet, Take 1 tablet (10 mg total) by mouth daily., Disp: 60 tablet, Rfl: 1   folic acid (FOLVITE) 1 MG tablet, Take 1 mg by  mouth daily., Disp: , Rfl:    furosemide (LASIX) 20 MG tablet, Take 1 tablet (20 mg total) by mouth every other day., Disp: 90 tablet, Rfl: 1   losartan (COZAAR) 50 MG tablet, Take 1 tablet (50 mg total) by mouth daily., Disp: 90 tablet, Rfl: 1   Misc. Devices (ADJUST BATH/SHOWER SEAT) MISC, 1 each by Does not apply route daily as needed., Disp: 1 each, Rfl: 0   Misc. Devices (WALL GRAB BAR) MISC, 1 each by Does not apply route as directed., Disp: 1 each, Rfl: 0   multivitamin (RENA-VIT) TABS tablet, Take 1 tablet by mouth daily., Disp: , Rfl:    OXcarbazepine (TRILEPTAL) 150 MG tablet, Take 1 tablet (150 mg total) by mouth daily., Disp: 90 tablet, Rfl: 1   triamcinolone cream (KENALOG) 0.1 %, Apply 1 Application  topically 2 (two) times daily., Disp: 30 g, Rfl: 0   No Known Allergies  ROS Review of Systems  Constitutional: Negative.  Negative for fatigue and fever.  HENT: Negative.  Negative for congestion, rhinorrhea and sore throat.   Eyes: Negative.   Respiratory: Negative.  Negative for cough and shortness of breath.   Cardiovascular: Negative.   Gastrointestinal: Negative.  Negative for diarrhea.  Endocrine: Negative.   Genitourinary: Negative.   Musculoskeletal: Negative.  Negative for joint pain.  Skin:  Positive for rash.  Allergic/Immunologic: Negative.   Neurological: Negative.   Hematological: Negative.   Psychiatric/Behavioral: Negative.    All other systems reviewed and are negative.     Objective:    Physical Exam Vitals reviewed.  Constitutional:      Appearance: Normal appearance.  HENT:     Mouth/Throat:     Mouth: Mucous membranes are moist.  Eyes:     Pupils: Pupils are equal, round, and reactive to light.  Neck:     Vascular: No carotid bruit.  Cardiovascular:     Rate and Rhythm: Normal rate and regular rhythm.     Pulses: Normal pulses.     Heart sounds: Normal heart sounds.  Pulmonary:     Effort: Pulmonary effort is normal.     Breath sounds: Normal breath sounds.  Abdominal:     General: Bowel sounds are normal.     Palpations: Abdomen is soft. There is no hepatomegaly, splenomegaly or mass.     Tenderness: There is no abdominal tenderness.     Hernia: No hernia is present.  Musculoskeletal:        General: No tenderness.     Cervical back: Neck supple.     Right lower leg: No edema.     Left lower leg: No edema.  Skin:    Findings: No rash.  Neurological:     Mental Status: She is alert and oriented to person, place, and time.     Motor: No weakness.  Psychiatric:        Mood and Affect: Mood and affect normal.        Behavior: Behavior normal.     BP 139/85   Pulse (!) 115   Ht '5\' 4"'$  (1.626 m)   Wt 177 lb 3.2 oz (80.4 kg)   BMI  30.42 kg/m  Wt Readings from Last 3 Encounters:  07/20/22 177 lb 3.2 oz (80.4 kg)  07/19/22 191 lb (86.6 kg)  07/05/22 191 lb (86.6 kg)     Health Maintenance Due  Topic Date Due   COVID-19 Vaccine (1) Never done   Hepatitis C Screening  Never done  TETANUS/TDAP  Never done   COLONOSCOPY (Pts 45-2yr Insurance coverage will need to be confirmed)  Never done   MAMMOGRAM  Never done   Zoster Vaccines- Shingrix (1 of 2) Never done   Pneumonia Vaccine 71 Years old (1 - PCV) Never done   DEXA SCAN  Never done   INFLUENZA VACCINE  07/13/2022    There are no preventive care reminders to display for this patient.  No results found for: "TSH" Lab Results  Component Value Date   WBC 9.3 03/24/2022   HGB 11.3 (L) 03/24/2022   HCT 35.5 (L) 03/24/2022   MCV 94.4 03/24/2022   PLT 203 03/24/2022   Lab Results  Component Value Date   NA 137 03/24/2022   K 4.2 03/24/2022   CO2 23 03/24/2022   GLUCOSE 105 (H) 03/24/2022   BUN 23 03/24/2022   CREATININE 1.72 (H) 03/24/2022   CALCIUM 7.8 (L) 03/24/2022   ANIONGAP 9 03/24/2022   No results found for: "CHOL" No results found for: "HDL" No results found for: "LDLCALC" No results found for: "TRIG" No results found for: "CHOLHDL" No results found for: "HGBA1C"    Assessment & Plan:   Problem List Items Addressed This Visit       Nervous and Auditory   Neuropathy - Primary    Patient was advised to continue using trazodone stop Cymbalta        Musculoskeletal and Integument   Atopic neurodermatitis    Stable, refer to frequent dermatologist      Relevant Medications   triamcinolone cream (KENALOG) 0.1 %     Other   AICD (automatic cardioverter/defibrillator) present    Stable no issues      Class 1 obesity due to excess calories without serious comorbidity with body mass index (BMI) of 32.0 to 32.9 in adult    - I encouraged the patient to lose weight.  - I educated them on making healthy dietary choices  including eating more fruits and vegetables and less fried foods. - I encouraged the patient to exercise more, and educated on the benefits of exercise including weight loss, diabetes prevention, and hypertension prevention.   Dietary counseling with a registered dietician  Referral to a weight management support group (e.g. Weight Watchers, Overeaters Anonymous)  If your BMI is greater than 29 or you have gained more than 15 pounds you should work on weight loss.  Attend a healthy cooking class       Neuropathic pain of both legs    Continue trazodone       Meds ordered this encounter  Medications   carvedilol (COREG) 25 MG tablet    Sig: Take 1 tablet (25 mg total) by mouth 2 (two) times daily.    Dispense:  180 tablet    Refill:  1   DISCONTD: DULoxetine (CYMBALTA) 30 MG capsule    Sig: Take 1 capsule (30 mg total) by mouth daily.    Dispense:  90 capsule    Refill:  1   furosemide (LASIX) 20 MG tablet    Sig: Take 1 tablet (20 mg total) by mouth every other day.    Dispense:  90 tablet    Refill:  1   losartan (COZAAR) 50 MG tablet    Sig: Take 1 tablet (50 mg total) by mouth daily.    Dispense:  90 tablet    Refill:  1   triamcinolone cream (KENALOG) 0.1 %    Sig: Apply 1 Application topically  2 (two) times daily.    Dispense:  30 g    Refill:  0   OXcarbazepine (TRILEPTAL) 150 MG tablet    Sig: Take 1 tablet (150 mg total) by mouth daily.    Dispense:  90 tablet    Refill:  1    Follow-up: No follow-ups on file.    Cletis Athens, MD

## 2022-07-20 NOTE — Assessment & Plan Note (Signed)
Stable no issues

## 2022-07-20 NOTE — Assessment & Plan Note (Signed)
Stable, refer to frequent dermatologist

## 2022-07-20 NOTE — Assessment & Plan Note (Signed)
Patient was advised to continue using trazodone stop Cymbalta

## 2022-07-20 NOTE — Assessment & Plan Note (Signed)
Continue trazodone 

## 2022-07-20 NOTE — Assessment & Plan Note (Signed)

## 2022-08-11 ENCOUNTER — Ambulatory Visit: Payer: Medicaid Other | Admitting: Student in an Organized Health Care Education/Training Program

## 2022-08-21 ENCOUNTER — Other Ambulatory Visit: Payer: Self-pay | Admitting: *Deleted

## 2022-08-21 DIAGNOSIS — Z1231 Encounter for screening mammogram for malignant neoplasm of breast: Secondary | ICD-10-CM

## 2022-08-30 DIAGNOSIS — Z23 Encounter for immunization: Secondary | ICD-10-CM | POA: Diagnosis not present

## 2022-08-31 ENCOUNTER — Encounter: Payer: Self-pay | Admitting: Internal Medicine

## 2022-08-31 ENCOUNTER — Ambulatory Visit (INDEPENDENT_AMBULATORY_CARE_PROVIDER_SITE_OTHER): Payer: Medicare Other | Admitting: Internal Medicine

## 2022-08-31 VITALS — BP 139/87 | HR 66 | Ht 64.0 in | Wt 177.7 lb

## 2022-08-31 DIAGNOSIS — E6609 Other obesity due to excess calories: Secondary | ICD-10-CM | POA: Diagnosis not present

## 2022-08-31 DIAGNOSIS — L600 Ingrowing nail: Secondary | ICD-10-CM | POA: Diagnosis not present

## 2022-08-31 DIAGNOSIS — M5136 Other intervertebral disc degeneration, lumbar region: Secondary | ICD-10-CM

## 2022-08-31 DIAGNOSIS — L2081 Atopic neurodermatitis: Secondary | ICD-10-CM | POA: Diagnosis not present

## 2022-08-31 DIAGNOSIS — F418 Other specified anxiety disorders: Secondary | ICD-10-CM | POA: Diagnosis not present

## 2022-08-31 DIAGNOSIS — Z95810 Presence of automatic (implantable) cardiac defibrillator: Secondary | ICD-10-CM | POA: Diagnosis not present

## 2022-08-31 DIAGNOSIS — Z1211 Encounter for screening for malignant neoplasm of colon: Secondary | ICD-10-CM | POA: Diagnosis not present

## 2022-08-31 DIAGNOSIS — G629 Polyneuropathy, unspecified: Secondary | ICD-10-CM | POA: Diagnosis not present

## 2022-08-31 DIAGNOSIS — Z6832 Body mass index (BMI) 32.0-32.9, adult: Secondary | ICD-10-CM

## 2022-08-31 MED ORDER — TRAZODONE HCL 50 MG PO TABS: 50.0000 mg | ORAL_TABLET | Freq: Every evening | ORAL | 1 refills | Status: AC

## 2022-08-31 MED ORDER — TRIAMCINOLONE ACETONIDE 0.1 % EX CREA: 1.0000 | TOPICAL_CREAM | Freq: Two times a day (BID) | CUTANEOUS | 4 refills | Status: AC

## 2022-08-31 MED ORDER — CETIRIZINE HCL 10 MG PO TABS: 10.0000 mg | ORAL_TABLET | Freq: Every day | ORAL | 2 refills | Status: AC

## 2022-08-31 NOTE — Progress Notes (Signed)
Established Patient Office Visit  Subjective:  Patient ID: Valerie Munoz, female    DOB: 1951/01/25  Age: 71 y.o. MRN: [id number]  CC:  Chief Complaint  Patient presents with   Follow-up    HPI  Valerie Munoz presents for check up  Past Medical History:  Diagnosis Date   Gout    Hospitalized for tx of Left foot 2017, per pt.   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 No pertinent family history.  Social History   Socioeconomic History   Marital status: Widowed    Spouse name: Not on file   Number of children: Not on file   Years of education: 14   Highest education level: Associate degree: academic program  Occupational History   Not on file  Tobacco Use   Smoking status: Never   Smokeless tobacco: Never  Substance and Sexual Activity   Alcohol use: Not Currently   Drug use: Never   Sexual activity: Not Currently  Other Topics Concern   Not on file  Social History Narrative   Not on file   Social Determinants of Health   Financial Resource Strain: Low Risk  (04/30/2022)   Overall Financial Resource Strain (CARDIA)    Difficulty of Paying Living Expenses: Not very hard  Food Insecurity: Food Insecurity Present (04/30/2022)   Hunger Vital Sign    Worried About Running Out of Food in the Last Year: Sometimes true    Ran Out of Food in the Last Year: Sometimes true  Transportation Needs: No Transportation Needs (04/30/2022)   PRAPARE - Hydrologist (Medical): No    Lack of Transportation (Non-Medical): No  Physical Activity: Inactive (04/30/2022)   Exercise Vital Sign    Days of Exercise per Week: 0 days    Minutes of Exercise per Session: 0 min  Stress: No Stress Concern Present (04/30/2022)   [place]     Feeling of Stress : Not at all  Social Connections: Unknown (04/30/2022)   Social Connection and Isolation Panel [NHANES]    Frequency of Communication with Friends and Family: Patient refused    Frequency of Social Gatherings with Friends and Family: Patient refused    Attends Religious Services: Patient refused    Active Member of Clubs or Organizations: Patient refused    Attends Archivist Meetings: Patient refused    Marital Status: Widowed  Intimate Partner Violence: Not At Risk (04/30/2022)   Humiliation, Afraid, Rape, and Kick questionnaire    Fear of Current or Ex-Partner: No    Emotionally Abused: No    Physically Abused: No    Sexually Abused: No     Current Outpatient Medications:    carvedilol (COREG) 25 MG tablet, Take 1 tablet (25 mg total) by mouth 2 (two) times daily., Disp: 180 tablet, Rfl: 1   folic acid (FOLVITE) 1 MG tablet, Take 1 mg by mouth daily., Disp: , Rfl:    furosemide (LASIX) 20 MG tablet, Take 1 tablet (20 mg total) by mouth every other day., Disp: 90 tablet, Rfl: 1   losartan (COZAAR) 50 MG tablet, Take 1 tablet (50 mg total) by mouth daily., Disp: 90 tablet, Rfl: 1   Misc. Devices (ADJUST BATH/SHOWER SEAT)  MISC, 1 each by Does not apply route daily as needed., Disp: 1 each, Rfl: 0   Misc. Devices (WALL GRAB BAR) MISC, 1 each by Does not apply route as directed., Disp: 1 each, Rfl: 0   multivitamin (RENA-VIT) TABS tablet, Take 1 tablet by mouth daily., Disp: , Rfl:    OXcarbazepine (TRILEPTAL) 150 MG tablet, Take 1 tablet (150 mg total) by mouth daily., Disp: 90 tablet, Rfl: 1   cetirizine (ZYRTEC) 10 MG tablet, Take 1 tablet (10 mg total) by mouth daily., Disp: 90 tablet, Rfl: 2   traZODone (DESYREL) 50 MG tablet, Take 1 tablet (50 mg total) by mouth every evening., Disp: 90 tablet, Rfl: 1   triamcinolone cream (KENALOG) 0.1 %, Apply 1 Application topically 2 (two) times daily., Disp: 30 g, Rfl: 4   No Known Allergies  ROS Review of Systems   Constitutional: Negative.   HENT: Negative.    Eyes: Negative.   Respiratory: Negative.    Cardiovascular: Negative.   Gastrointestinal: Negative.   Endocrine: Negative.   Genitourinary: Negative.   Musculoskeletal: Negative.   Skin: Negative.   Allergic/Immunologic: Negative.   Neurological: Negative.   Hematological: Negative.   Psychiatric/Behavioral: Negative.    All other systems reviewed and are negative.     Objective:    Physical Exam Vitals reviewed.  Constitutional:      Appearance: Normal appearance.  HENT:     Mouth/Throat:     Mouth: Mucous membranes are moist.  Eyes:     Pupils: Pupils are equal, round, and reactive to light.  Neck:     Vascular: No carotid bruit.  Cardiovascular:     Rate and Rhythm: Normal rate and regular rhythm.     Pulses: Normal pulses.     Heart sounds: Normal heart sounds.  Pulmonary:     Effort: Pulmonary effort is normal.     Breath sounds: Normal breath sounds.  Abdominal:     General: Bowel sounds are normal.     Palpations: Abdomen is soft. There is no hepatomegaly, splenomegaly or mass.     Tenderness: There is no abdominal tenderness.     Hernia: No hernia is present.  Musculoskeletal:        General: No tenderness.     Cervical back: Neck supple.     Right lower leg: No edema.     Left lower leg: No edema.  Skin:    Findings: No rash.  Neurological:     Mental Status: She is alert and oriented to person, place, and time.     Motor: No weakness.  Psychiatric:        Mood and Affect: Mood and affect normal.        Behavior: Behavior normal.     BP 139/87   Pulse 66   Ht '5\' 4"'$  (1.626 m)   Wt 177 lb 11.2 oz (80.6 kg)   BMI 30.50 kg/m  Wt Readings from Last 3 Encounters:  08/31/22 177 lb 11.2 oz (80.6 kg)  07/20/22 177 lb 3.2 oz (80.4 kg)  07/19/22 191 lb (86.6 kg)     Health Maintenance Due  Topic Date Due   COVID-19 Vaccine (1) Never done   TETANUS/TDAP  Never done   COLONOSCOPY (Pts 45-36yr  Insurance coverage will need to be confirmed)  Never done   MAMMOGRAM  Never done   Zoster Vaccines- Shingrix (1 of 2) Never done   Pneumonia Vaccine 71 Years old (1 - PCV) Never done  There are no preventive care reminders to display for this patient.  No results found for: "TSH" Lab Results  Component Value Date   WBC 9.3 03/24/2022   HGB 11.3 (L) 03/24/2022   HCT 35.5 (L) 03/24/2022   MCV 94.4 03/24/2022   PLT 203 03/24/2022   Lab Results  Component Value Date   NA 137 03/24/2022   K 4.2 03/24/2022   CO2 23 03/24/2022   GLUCOSE 105 (H) 03/24/2022   BUN 23 03/24/2022   CREATININE 1.72 (H) 03/24/2022   CALCIUM 7.8 (L) 03/24/2022   ANIONGAP 9 03/24/2022   No results found for: "CHOL" No results found for: "HDL" No results found for: "LDLCALC" No results found for: "TRIG" No results found for: "CHOLHDL" No results found for: "HGBA1C"    Assessment & Plan:   Problem List Items Addressed This Visit       Nervous and Auditory   Neuropathy    Stable at the present time        Musculoskeletal and Integument   Atopic neurodermatitis   Relevant Medications   triamcinolone cream (KENALOG) 0.1 %   cetirizine (ZYRTEC) 10 MG tablet   Lumbar degenerative disc disease    Refer to the pain specialist        Other   AICD (automatic cardioverter/defibrillator) present    Pacemaker is working well there is no active discharge recently      Class 1 obesity due to excess calories without serious comorbidity with body mass index (BMI) of 32.0 to 32.9 in adult    - I encouraged the patient to lose weight.  - I educated them on making healthy dietary choices including eating more fruits and vegetables and less fried foods. - I encouraged the patient to exercise more, and educated on the benefits of exercise including weight loss, diabetes prevention, and hypertension prevention.   Dietary counseling with a registered dietician  Referral to a weight management support  group (e.g. Weight Watchers, Overeaters Anonymous)  If your BMI is greater than 29 or you have gained more than 15 pounds you should work on weight loss.  Attend a healthy cooking class       Other Visit Diagnoses     Ingrown toenail of both feet    -  Primary   Relevant Orders   Ambulatory referral to Podiatry   Depression with anxiety       Relevant Medications   traZODone (DESYREL) 50 MG tablet   Other Relevant Orders   Ambulatory referral to Psychiatry   Screening for colon cancer       Relevant Orders   Ambulatory referral to Gastroenterology       Meds ordered this encounter  Medications   triamcinolone cream (KENALOG) 0.1 %    Sig: Apply 1 Application topically 2 (two) times daily.    Dispense:  30 g    Refill:  4   cetirizine (ZYRTEC) 10 MG tablet    Sig: Take 1 tablet (10 mg total) by mouth daily.    Dispense:  90 tablet    Refill:  2   traZODone (DESYREL) 50 MG tablet    Sig: Take 1 tablet (50 mg total) by mouth every evening.    Dispense:  90 tablet    Refill:  1    Follow-up: No follow-ups on file.    Cletis Athens, MD

## 2022-08-31 NOTE — Assessment & Plan Note (Signed)
Refer to the pain specialist

## 2022-08-31 NOTE — Assessment & Plan Note (Signed)
Stable at the present time. 

## 2022-08-31 NOTE — Assessment & Plan Note (Signed)
Pacemaker is working well there is no active discharge recently

## 2022-08-31 NOTE — Assessment & Plan Note (Signed)

## 2022-09-03 ENCOUNTER — Other Ambulatory Visit: Payer: Self-pay | Admitting: *Deleted

## 2022-09-03 ENCOUNTER — Telehealth: Payer: Self-pay | Admitting: *Deleted

## 2022-09-03 DIAGNOSIS — Z1211 Encounter for screening for malignant neoplasm of colon: Secondary | ICD-10-CM

## 2022-09-03 MED ORDER — NA SULFATE-K SULFATE-MG SULF 17.5-3.13-1.6 GM/177ML PO SOLN: 1.0000 | Freq: Once | ORAL | 0 refills | Status: AC

## 2022-09-03 NOTE — Telephone Encounter (Signed)
Gastroenterology Pre-Procedure Review  Request Date: 10/19/2022 Requesting Physician: Dr. Marius Ditch  PATIENT REVIEW QUESTIONS: The patient responded to the following health history questions as indicated:    1. Are you having any GI issues? no 2. Do you have a personal history of Polyps? no 3. Do you have a family history of Colon Cancer or Polyps? no 4. Diabetes Mellitus? no 5. Joint replacements in the past 12 months?no 6. Major health problems in the past 3 months?no 7. Any artificial heart valves, MVP, or defibrillator?yes (defibrillator, does not see cardiology. Patient stated that she is managed by pcp Dr Lavera Guise)    [place]:    Patient reports the following regarding taking any anticoagulation/antiplatelet therapy:   Plavix, Coumadin, Eliquis, Xarelto, Lovenox, Pradaxa, Brilinta, or Effient? no Aspirin? no  Patient confirms/reports the following medications:  Current Outpatient Medications  Medication Sig Dispense Refill   carvedilol (COREG) 25 MG tablet Take 1 tablet (25 mg total) by mouth 2 (two) times daily. 180 tablet 1   cetirizine (ZYRTEC) 10 MG tablet Take 1 tablet (10 mg total) by mouth daily. 90 tablet 2   folic acid (FOLVITE) 1 MG tablet Take 1 mg by mouth daily.     furosemide (LASIX) 20 MG tablet Take 1 tablet (20 mg total) by mouth every other day. 90 tablet 1   losartan (COZAAR) 50 MG tablet Take 1 tablet (50 mg total) by mouth daily. 90 tablet 1   Misc. Devices (ADJUST BATH/SHOWER SEAT) MISC 1 each by Does not apply route daily as needed. 1 each 0   Misc. Devices (WALL GRAB BAR) MISC 1 each by Does not apply route as directed. 1 each 0   multivitamin (RENA-VIT) TABS tablet Take 1 tablet by mouth daily.     OXcarbazepine (TRILEPTAL) 150 MG tablet Take 1 tablet (150 mg total) by mouth daily. 90 tablet 1   traZODone (DESYREL) 50 MG tablet Take 1 tablet (50 mg total) by mouth every evening. 90 tablet 1   triamcinolone cream (KENALOG) 0.1 % Apply 1  Application topically 2 (two) times daily. 30 g 4   No current facility-administered medications for this visit.    Patient confirms/reports the following allergies:  No Known Allergies  No orders of the defined types were placed in this encounter.   AUTHORIZATION INFORMATION Primary Insurance: 1D#: Group #:  Secondary Insurance: 1D#: Group #:  SCHEDULE INFORMATION: Date: 10/19/2022 Time: Location: [place]

## 2022-09-07 ENCOUNTER — Ambulatory Visit
Payer: Medicare Other | Attending: Student in an Organized Health Care Education/Training Program | Admitting: Student in an Organized Health Care Education/Training Program

## 2022-09-07 ENCOUNTER — Encounter: Payer: Self-pay | Admitting: Student in an Organized Health Care Education/Training Program

## 2022-09-07 VITALS — BP 131/88 | HR 86 | Temp 97.1°F | Resp 16 | Ht 64.0 in | Wt 177.0 lb

## 2022-09-07 DIAGNOSIS — M25512 Pain in left shoulder: Secondary | ICD-10-CM | POA: Insufficient documentation

## 2022-09-07 DIAGNOSIS — G5680 Other specified mononeuropathies of unspecified upper limb: Secondary | ICD-10-CM | POA: Insufficient documentation

## 2022-09-07 DIAGNOSIS — G8929 Other chronic pain: Secondary | ICD-10-CM | POA: Diagnosis not present

## 2022-09-07 DIAGNOSIS — G894 Chronic pain syndrome: Secondary | ICD-10-CM

## 2022-09-07 DIAGNOSIS — M172 Bilateral post-traumatic osteoarthritis of knee: Secondary | ICD-10-CM | POA: Diagnosis not present

## 2022-09-07 DIAGNOSIS — M25562 Pain in left knee: Secondary | ICD-10-CM | POA: Diagnosis not present

## 2022-09-07 DIAGNOSIS — M19012 Primary osteoarthritis, left shoulder: Secondary | ICD-10-CM

## 2022-09-07 DIAGNOSIS — G5682 Other specified mononeuropathies of left upper limb: Secondary | ICD-10-CM

## 2022-09-07 DIAGNOSIS — M25561 Pain in right knee: Secondary | ICD-10-CM | POA: Insufficient documentation

## 2022-09-07 NOTE — Progress Notes (Signed)
PROVIDER NOTE: Information contained herein reflects review and annotations entered in association with encounter. Interpretation of such information and data should be left to medically-trained personnel. Information provided to patient can be located elsewhere in the medical record under "Patient Instructions". Document created using STT-dictation technology, any transcriptional errors that may result from process are unintentional.    Patient: Valerie Munoz  Service Category: E/M  Provider: Gillis Santa, MD  DOB: November 18, 1951  DOS: 09/07/2022  Referring Provider: Cletis Athens, MD  MRN: [id number]  Specialty: Interventional Pain Management  PCP: Cletis Athens, MD  Type: Established Patient  Setting: Ambulatory outpatient    Location: Office  Delivery: Face-to-face     HPI  Ms. Valerie Munoz, a 71 y.o. 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 Ms. Valerie Munoz primary complain today is Back Pain (lower) and Hip Pain (left) Last encounter: My last encounter with her was on 08/11/2022. Pertinent problems: Ms. Valerie Munoz has Neuropathic pain of both legs; Chronic left shoulder pain; Lumbar degenerative disc disease; Lumbar facet arthropathy; Arthritis of left glenohumeral joint; Chronic pain syndrome; and Bilateral post-traumatic osteoarthritis of knee on their pertinent problem list. Pain Assessment: Severity of Chronic pain is reported as a 10-Worst pain ever/10. Location: Back Lower/radiates into left hip. Onset: More than a month ago. Quality: Discomfort, Constant, Numbness, Tingling. Timing: Constant. Modifying factor(s): nothing. Vitals:  height is 5' 4"  (1.626 m) and weight is 177 lb (80.3 kg). Her temperature is 97.1 F (36.2 C) (abnormal). Her blood pressure is 131/88 and her pulse is 86. Her respiration is 16 and oxygen saturation is 95%.   Reason for encounter: post-procedure evaluation and assessment.   Post-procedure evaluation    Procedure: Glenohumeral Joint (shoulder) Injection #1  Laterality: Left (-LT)  Level: Shoulder   Imaging: Fluoroscopy-guided         Anesthesia: Local anesthesia (1-2% Lidocaine)  Sedation: Minimal Sedation Valium 5 mg p.o                .  DOS: 07/19/2022  Performed by: Gillis Santa, MD   Bilateral intra-articular knee steroid injection without fluoroscopy  Purpose: Diagnostic/Therapeutic Indications: Shoulder pain severe enough to impact quality of life or function. Rationale (medical necessity): procedure needed and proper for the diagnosis and/or treatment of Ms. Valerie Munoz medical symptoms and needs. 1. Arthritis of left glenohumeral joint   2. Chronic left shoulder pain   3. Chronic pain of both knees   4. Bilateral post-traumatic osteoarthritis of knee   5. Chronic pain syndrome    NAS-11 Pain score:   Pre-procedure: 8 /10   Post-procedure: 7 /10       Effectiveness:  Initial hour after procedure: 100 %  Subsequent 4-6 hours post-procedure: 100 %  Analgesia past initial 6 hours: 25 % (lsting a couple of weeks)  Ongoing improvement:  Analgesic:  25-30% Function: Ms. Valerie Munoz reports improvement in function ROM: Ms. Valerie Munoz reports improvement in ROM   ROS  Constitutional: Denies any fever or chills Gastrointestinal: No reported hemesis, hematochezia, vomiting, or acute GI distress Musculoskeletal:  Left shoulder pain, bilateral knee pain. Neurological: No reported episodes of acute onset apraxia, aphasia, dysarthria, agnosia, amnesia, paralysis, loss of coordination, or loss of consciousness  Medication Review  Adjust Bath/Shower Seat, OXcarbazepine, Wall Grab Bar, carvedilol, cetirizine, folic acid, furosemide, losartan, multivitamin, traZODone, and triamcinolone cream  History Review  Allergy: Ms. Valerie Munoz has No Known Allergies. Drug: Ms. Valerie Munoz  reports no history of drug use. Alcohol:  reports that she does not currently use alcohol. Tobacco:  reports that she  has never smoked. She has never used smokeless tobacco. Social: Ms. Valerie Munoz  reports that she has never smoked. She has never used smokeless tobacco. She reports that she does not currently use alcohol. She reports that she does not use drugs. Medical:  has a past medical history of Gout, Heart disease, Hypertension, Migraine headache, and Neuropathy. Surgical: Ms. Valerie Munoz  has a past surgical history that includes eye tumor removal (1977) and Abdominal hysterectomy. Family: family history is not on file.  Laboratory Chemistry Profile   Renal Lab Results  Component Value Date   BUN 23 03/24/2022   CREATININE 1.72 (H) 03/24/2022   GFRNONAA 32 (L) 03/24/2022    Hepatic No results found for: "AST", "ALT", "ALBUMIN", "ALKPHOS", "HCVAB", "AMYLASE", "LIPASE", "AMMONIA"  Electrolytes Lab Results  Component Value Date   NA 137 03/24/2022   K 4.2 03/24/2022   CL 105 03/24/2022   CALCIUM 7.8 (L) 03/24/2022   MG 2.2 06/11/2022    Bone Lab Results  Component Value Date   VD25OH 11.07 (L) 06/11/2022    Inflammation (CRP: Acute Phase) (ESR: Chronic Phase) No results found for: "CRP", "ESRSEDRATE", "LATICACIDVEN"       Note: Above Lab results reviewed.   Physical Exam  General appearance: Well nourished, well developed, and well hydrated. In no apparent acute distress Mental status: Alert, oriented x 3 (person, place, & time)       Respiratory: No evidence of acute respiratory distress Eyes: PERLA Vitals: BP 131/88   Pulse 86   Temp (!) 97.1 F (36.2 C)   Resp 16   Ht 5' 4"  (1.626 m)   Wt 177 lb (80.3 kg)   SpO2 95%   BMI 30.38 kg/m  BMI: Estimated body mass index is 30.38 kg/m as calculated from the following:   Height as of this encounter: 5' 4"  (1.626 m).   Weight as of this encounter: 177 lb (80.3 kg). Ideal: Ideal body weight: 54.7 kg (120 lb 9.5 oz) Adjusted ideal body weight: 64.9 kg (143 lb 2.5 oz)  Cervical Spine Area Exam  Skin & Axial Inspection: No masses,  redness, edema, swelling, or associated skin lesions Alignment: Symmetrical Functional ROM: Unrestricted ROM      Stability: No instability detected Muscle Tone/Strength: Functionally intact. No obvious neuro-muscular anomalies detected. Sensory (Neurological): Unimpaired Palpation: No palpable anomalies           Upper Extremity (UE) Exam      Side: Right upper extremity   Side: Left upper extremity  Skin & Extremity Inspection: Skin color, temperature, and hair growth are WNL. No peripheral edema or cyanosis. No masses, redness, swelling, asymmetry, or associated skin lesions. No contractures.   Skin & Extremity Inspection: Skin color, temperature, and hair growth are WNL. No peripheral edema or cyanosis. No masses, redness, swelling, asymmetry, or associated skin lesions. No contractures.  Functional ROM: Unrestricted ROM           Functional ROM: Pain restricted ROM for shoulder  Muscle Tone/Strength: Functionally intact. No obvious neuro-muscular anomalies detected.   Muscle Tone/Strength: Functionally intact. No obvious neuro-muscular anomalies detected.  Sensory (Neurological): Unimpaired           Sensory (Neurological): Musculoskeletal pain pattern          Palpation: No palpable anomalies               Palpation: No palpable anomalies  Provocative Test(s):  Phalen's test: deferred Tinel's test: deferred Apley's scratch test (touch opposite shoulder):  Action 1 (Across chest): deferred Action 2 (Overhead): deferred Action 3 (LB reach): deferred     Provocative Test(s):  Phalen's test: deferred Tinel's test: deferred Apley's scratch test (touch opposite shoulder):  Action 1 (Across chest): Decreased ROM Action 2 (Overhead): Decreased ROM Action 3 (LB reach): Decreased ROM      Thoracic Spine Area Exam  Skin & Axial Inspection: No masses, redness, or swelling Alignment: Symmetrical Functional ROM: Unrestricted ROM Stability: No instability detected Muscle  Tone/Strength: Functionally intact. No obvious neuro-muscular anomalies detected. Sensory (Neurological): Unimpaired Muscle strength & Tone: No palpable anomalies     Lumbar Spine Area Exam  Skin & Axial Inspection: No masses, redness, or swelling Alignment: Symmetrical Functional ROM: Pain restricted ROM       Stability: No instability detected Muscle Tone/Strength: Functionally intact. No obvious neuro-muscular anomalies detected. Sensory (Neurological): Musculoskeletal pain pattern Palpation: No palpable anomalies       Provocative Tests: Hyperextension/rotation test: (+) bilaterally for facet joint pain.   Gait & Posture Assessment  Ambulation: Unassisted Gait: Relatively normal for age and body habitus Posture: WNL    Lower Extremity Exam      Side: Right lower extremity   Side: Left lower extremity  Stability: No instability observed           Stability: No instability observed          Skin & Extremity Inspection: Skin color, temperature, and hair growth are WNL. No peripheral edema or cyanosis. No masses, redness, swelling, asymmetry, or associated skin lesions. No contractures.   Skin & Extremity Inspection: Skin color, temperature, and hair growth are WNL. No peripheral edema or cyanosis. No masses, redness, swelling, asymmetry, or associated skin lesions. No contractures.  Functional ROM: Pain restricted ROM for knee joint           Functional ROM: Pain restricted ROM for knee joint          Muscle Tone/Strength: Functionally intact. No obvious neuro-muscular anomalies detected.   Muscle Tone/Strength: Functionally intact. No obvious neuro-muscular anomalies detected.  Sensory (Neurological): Neuropathic pain pattern      and arthropathic   Sensory (Neurological): Neuropathic pain pattern      and arthropathic  DTR: Patellar: deferred today Achilles: deferred today Plantar: deferred today   DTR: Patellar: deferred today Achilles: deferred today Plantar: deferred today   Palpation: No palpable anomalies   Palpation: No palpable anomalies     Assessment   Diagnosis Status  1. Bilateral post-traumatic osteoarthritis of knee   2. Chronic left shoulder pain   3. Arthritis of left glenohumeral joint   4. Chronic pain of both knees   5. Chronic pain syndrome   6. Compression of left suprascapular nerve    Persistent Persistent Persistent   Updated Problems: Problem  Bilateral Post-Traumatic Osteoarthritis of Knee  Neuropathic Pain of Both Legs  Chronic Left Shoulder Pain  Lumbar Degenerative Disc Disease  Lumbar Facet Arthropathy  Arthritis of Left Glenohumeral Joint  Chronic Pain Syndrome     Plan of Care   Orders:  Orders Placed This Encounter  Procedures   SUPRASCAPULAR NERVE BLOCK    For shoulder pain.    Standing Status:   Future    Standing Expiration Date:   12/07/2022    Scheduling Instructions:     Purpose: LEFT     Level(s): Suprascapular notch     Sedation: PO Valium  Scheduling Timeframe: As permitted by the schedule    Order Specific Question:   Where will this procedure be performed?    Answer:   [facility]    Future considerations include genicular nerve block and possible RFA and possible suprascapular RFA or peripheral nerve stim  Follow-up plan:   Return in about 3 weeks (around 09/28/2022) for Left SSNB, in clinic (PO Valium).     Bilateral intra-articular knee steroid 07/19/2022, left anterior glenohumeral joint injection 07/19/2022.    Recent Visits Date Type Provider Dept  07/19/22 Procedure visit Gillis Santa, MD [facility]  07/05/22 Office Visit Gillis Santa, MD [facility]  06/09/22 Office Visit Gillis Santa, MD [facility]  Showing recent visits within past 90 days and meeting all other requirements Today's Visits Date Type Provider Dept  09/07/22 Office Visit Gillis Santa, MD [facility]  Showing today's visits and meeting all other  requirements Future Appointments No visits were found meeting these conditions. Showing future appointments within next 90 days and meeting all other requirements  I discussed the assessment and treatment plan with the patient. The patient was provided an opportunity to ask questions and all were answered. The patient agreed with the plan and demonstrated an understanding of the instructions.  Patient advised to call back or seek an in-person evaluation if the symptoms or condition worsens.  Duration of encounter: 72mnutes.  Total time on encounter, as per AMA guidelines included both the face-to-face and non-face-to-face time personally spent by the physician and/or other qualified health care professional(s) on the day of the encounter (includes time in activities that require the physician or other qualified health care professional and does not include time in activities normally performed by clinical staff). Physician's time may include the following activities when performed: preparing to see the patient (eg, review of tests, pre-charting review of records) obtaining and/or reviewing separately obtained history performing a medically appropriate examination and/or evaluation counseling and educating the patient/family/caregiver ordering medications, tests, or procedures referring and communicating with other health care professionals (when not separately reported) documenting clinical information in the electronic or other health record independently interpreting results (not separately reported) and communicating results to the patient/ family/caregiver care coordination (not separately reported)  Note by: BGillis Santa MD Date: 09/07/2022; Time: 10:31 AM

## 2022-09-07 NOTE — Progress Notes (Signed)
Safety precautions to be maintained throughout the outpatient stay will include: orient to surroundings, keep bed in low position, maintain call bell within reach at all times, provide assistance with transfer out of bed and ambulation.  

## 2022-09-07 NOTE — Patient Instructions (Signed)
Bring a driver

## 2022-09-09 ENCOUNTER — Ambulatory Visit: Payer: Medicare Other | Admitting: Student in an Organized Health Care Education/Training Program

## 2022-09-14 ENCOUNTER — Ambulatory Visit (INDEPENDENT_AMBULATORY_CARE_PROVIDER_SITE_OTHER): Payer: Medicare Other | Admitting: Podiatry

## 2022-09-14 DIAGNOSIS — B351 Tinea unguium: Secondary | ICD-10-CM | POA: Diagnosis not present

## 2022-09-14 DIAGNOSIS — M79674 Pain in right toe(s): Secondary | ICD-10-CM | POA: Diagnosis not present

## 2022-09-14 DIAGNOSIS — M79675 Pain in left toe(s): Secondary | ICD-10-CM | POA: Diagnosis not present

## 2022-09-14 NOTE — Progress Notes (Signed)
   Chief Complaint  Patient presents with   foot care    Patient is here for foot care,patient states the toe nails are dark in color with callouses on the bottom and left great toe callous.    SUBJECTIVE Patient presents to office today complaining of elongated, thickened nails that cause pain while ambulating in shoes.  Patient is unable to trim their own nails. Patient is here for further evaluation and treatment.  Past Medical History:  Diagnosis Date   Gout    Hospitalized for tx of Left foot 2017, per pt.   Heart disease    Hypertension    Migraine headache    Neuropathy     No Known Allergies   OBJECTIVE General Patient is awake, alert, and oriented x 3 and in no acute distress. Derm Skin is dry and supple bilateral. Negative open lesions or macerations. Remaining integument unremarkable. Nails are tender, long, thickened and dystrophic with subungual debris, consistent with onychomycosis, 1-5 bilateral. No signs of infection noted. Vasc  DP and PT pedal pulses palpable bilaterally. Temperature gradient within normal limits.  Neuro Epicritic and protective threshold sensation grossly intact bilaterally.  Musculoskeletal Exam No symptomatic pedal deformities noted bilateral. Muscular strength within normal limits.  ASSESSMENT 1.  Pain due to onychomycosis of toenails both  PLAN OF CARE 1. Patient evaluated today.  2. Instructed to maintain good pedal hygiene and foot care.  3. Mechanical debridement of nails 1-5 bilaterally performed using a nail nipper. Filed with dremel without incident.  4. Return to clinic in 3 mos.    Edrick Kins, DPM [facility]  Dr. Edrick Kins, DPM    [address], [place] [address]                Office [phone]  Fax [phone]

## 2022-09-22 ENCOUNTER — Ambulatory Visit: Payer: Medicare Other | Admitting: Student in an Organized Health Care Education/Training Program

## 2022-09-23 ENCOUNTER — Ambulatory Visit (INDEPENDENT_AMBULATORY_CARE_PROVIDER_SITE_OTHER): Payer: Medicare Other | Admitting: Dermatology

## 2022-09-23 DIAGNOSIS — L821 Other seborrheic keratosis: Secondary | ICD-10-CM | POA: Diagnosis not present

## 2022-09-23 DIAGNOSIS — L818 Other specified disorders of pigmentation: Secondary | ICD-10-CM

## 2022-09-23 DIAGNOSIS — Z79899 Other long term (current) drug therapy: Secondary | ICD-10-CM | POA: Diagnosis not present

## 2022-09-23 DIAGNOSIS — L820 Inflamed seborrheic keratosis: Secondary | ICD-10-CM

## 2022-09-23 DIAGNOSIS — L811 Chloasma: Secondary | ICD-10-CM | POA: Diagnosis not present

## 2022-09-23 NOTE — Patient Instructions (Addendum)
Melasma is a chronic; persistent condition of hyperpigmented patches generally on the face, worse in summer due to higher UV exposure.    Heredity; thyroid disease; sun exposure; pregnancy; birth control pills; epilepsy medication and darker skin may predispose to Melasma.   Recommendations include: - Sun avoidance and daily broad spectrum (UVA/UVB) sunscreen SPF 30+, preferably with Zinc or Titanium Dioxide. - Rx topical bleaching creams (i.e. hydroquinone) is a common treatment but should not be used long term.  Hydroquinones may be mixed with retinoids; steroids; Kojic Acid. - Rx Azelaic Acid is also a treatment option that is safe for pregnancy (Category B). - OTC Heliocare can be helpful in control and prevention. - Oral Rx with Tranexamic Acid 250 mg - 650 mg po daily can be used for moderate to severe cases especially during summer (contraindications include pregnancy; lactation; hx of PE; DVT; clotting disorder; heart disease; anticoagulant use and upcoming long trips)   - Chemical peels (would need multiple for best result).  - Lasers and  Microdermabrasion may also be helpful adjunct treatments.   Instructions for [facility] Medications  One or more of your medications was sent to the [facility] mail order compounding pharmacy. You will receive an email from them and can purchase the medicine through that link. It will then be mailed to your home at the address you confirmed. If for any reason you do not receive an email from them, please check your spam folder. If you still do not find the email, please let us know. [facility] phone number is [phone].    Cryotherapy Aftercare  Wash gently with soap and water everyday.   Apply Vaseline and Band-Aid daily until healed.    Due to recent changes in healthcare laws, you may see results of your pathology and/or laboratory studies on MyChart before the doctors have had a chance to review them. We understand that in some  cases there may be results that are confusing or concerning to you. Please understand that not all results are received at the same time and often the doctors may need to interpret multiple results in order to provide you with the best plan of care or course of treatment. Therefore, we ask that you please give [place] 2 business days to thoroughly review all your results before contacting the office for clarification. Should we see a critical lab result, you will be contacted sooner.   If You Need Anything After Your Visit  If you have any questions or concerns for your doctor, please call our main line at [phone] and press option 4 to reach your doctor's medical assistant. If no one answers, please leave a voicemail as directed and we will return your call as soon as possible. Messages left after 4 pm will be answered the following business day.   You may also send [place] a message via [place]. We typically respond to MyChart messages within 1-2 business days.  For prescription refills, please ask your pharmacy to contact our office. Our fax number is [phone].  If you have an urgent issue when the clinic is closed that cannot wait until the next business day, you can page your doctor at the number below.    Please note that while we do our best to be available for urgent issues outside of office hours, we are not available 24/7.   If you have an urgent issue and are unable to reach [place], you may choose to seek medical care at your doctor's office,  retail clinic, urgent care center, or emergency room.  If you have a medical emergency, please immediately call 911 or go to the emergency department.  Pager Numbers  - Dr. Nehemiah Massed: [phone]  - Dr. Laurence Ferrari: [phone]  - Dr. Nicole Kindred: [phone]  In the event of inclement weather, please call our main line at [phone] for an update on the status of any delays or closures.  Dermatology Medication Tips: Please keep the boxes that  topical medications come in in order to help keep track of the instructions about where and how to use these. Pharmacies typically print the medication instructions only on the boxes and not directly on the medication tubes.   If your medication is too expensive, please contact our office at [phone] option 4 or send [place] a message through [place].   We are unable to tell what your co-pay for medications will be in advance as this is different depending on your insurance coverage. However, we may be able to find a substitute medication at lower cost or fill out paperwork to get insurance to cover a needed medication.   If a prior authorization is required to get your medication covered by your insurance company, please allow [place] 1-2 business days to complete this process.  Drug prices often vary depending on where the prescription is filled and some pharmacies may offer cheaper prices.  The website www.goodrx.com contains coupons for medications through different pharmacies. The prices here do not account for what the cost may be with help from insurance (it may be cheaper with your insurance), but the website can give you the price if you did not use any insurance.  - You can print the associated coupon and take it with your prescription to the pharmacy.  - You may also stop by our office during regular business hours and pick up a GoodRx coupon card.  - If you need your prescription sent electronically to a different pharmacy, notify our office through [facility] or by phone at [phone] option 4.     Si Usted Necesita Algo Despus de Su Visita  Tambin puede enviarnos un mensaje a travs de Pharmacist, community. Por lo general respondemos a los mensajes de MyChart en el transcurso de 1 a 2 das hbiles.  Para renovar recetas, por favor pida a su farmacia que se ponga en contacto con nuestra oficina. Harland Dingwall de fax es [place] [phone].  Si tiene un asunto urgente cuando la clnica  est cerrada y que no puede esperar hasta el siguiente da hbil, puede llamar/localizar a su doctor(a) al nmero que aparece a continuacin.   Por favor, tenga en cuenta que aunque hacemos todo lo posible para estar disponibles para asuntos urgentes fuera del horario de [place], no estamos disponibles las 24 horas del da, los 7 das de la [place].   Si tiene un problema urgente y no puede comunicarse con nosotros, puede optar por buscar atencin mdica  en el consultorio de su doctor(a), en una clnica privada, en un centro de atencin urgente o en una sala de emergencias.  Si tiene Engineering geologist, por favor llame inmediatamente al 911 o vaya a la sala de emergencias.  Nmeros de bper  - Dr. Nehemiah Massed: [phone]  - Dra. Moye: [phone]  - Dra. Nicole Kindred: [phone]  En caso de inclemencias del [place], por favor llame a Johnsie Kindred principal al [phone] para una actualizacin sobre el [place] de cualquier retraso o cierre.  Consejos para la medicacin en dermatologa: Por favor, guarde  las cajas en las que vienen los medicamentos de uso tpico para ayudarle a seguir las instrucciones sobre dnde y cmo usarlos. Las farmacias generalmente imprimen las instrucciones del medicamento slo en las cajas y no directamente en los tubos del [place].   Si su medicamento es muy caro, por favor, pngase en contacto con Zigmund Daniel llamando al [phone] y presione la opcin 4 o envenos un mensaje a travs de Pharmacist, community.   No podemos decirle cul ser su copago por los medicamentos por adelantado ya que esto es diferente dependiendo de la cobertura de su seguro. Sin embargo, es posible que podamos encontrar un medicamento sustituto a Electrical engineer un formulario para que el seguro cubra el medicamento que se considera necesario.   Si se requiere una autorizacin previa para que su compaa de seguros [place] su medicamento, por favor permtanos de 1 a 2 das hbiles para  completar este proceso.  Los precios de los medicamentos varan con frecuencia dependiendo del Environmental consultant de dnde se surte la receta y alguna farmacias pueden ofrecer precios ms baratos.  El sitio web www.goodrx.com tiene cupones para medicamentos de Airline pilot. Los precios aqu no tienen en cuenta lo que podra costar con la ayuda del seguro (puede ser ms barato con su seguro), pero el sitio web puede darle el precio si no utiliz Research scientist (physical sciences).  - Puede imprimir el cupn correspondiente y llevarlo con su receta a la farmacia.  - Tambin puede pasar por nuestra oficina durante el horario de atencin regular y Charity fundraiser una tarjeta de cupones de GoodRx.  - Si necesita que su receta se enve electrnicamente a una farmacia diferente, informe a nuestra oficina a travs de MyChart de [place] o por telfono llamando al [phone] y presione la opcin 4.

## 2022-09-23 NOTE — Progress Notes (Signed)
New Patient Visit  Subjective  Valerie Munoz is a 71 y.o. female who presents for the following: Other (New patient - spots on legs that are spreading and are itchy).  The following portions of the chart were reviewed this encounter and updated as appropriate:   Tobacco  Allergies  Meds  Problems  Med Hx  Surg Hx  Fam Hx     Review of Systems:  No other skin or systemic complaints except as noted in HPI or Assessment and Plan.  Objective  Well appearing patient in no apparent distress; mood and affect are within normal limits.  A focused examination was performed including face, legs, feet. Relevant physical exam findings are noted in the Assessment and Plan.  face Stuck-on, waxy, tan-brown papule or plaque --Discussed benign etiology and prognosis.   Legs Hypopigmented macules  Face Hyperpigmented patches           Right dorsum foot near ankle (3) Erythematous stuck-on, waxy papule or plaque   Assessment & Plan  Seborrheic keratosis face Benign-appearing.  Observation.  Call clinic for new or changing lesions.   Idiopathic guttate hypomelanosis Legs Benign-appearing.  Observation.  Call clinic for new or changing lesions.   Melasma Face Melasma is a chronic; persistent condition of hyperpigmented patches generally on the face, worse in summer due to higher UV exposure.    Heredity; thyroid disease; sun exposure; pregnancy; birth control pills; epilepsy medication and darker skin may predispose to Melasma.   Recommendations include: - Sun avoidance and daily broad spectrum (UVA/UVB) sunscreen SPF 30+, preferably with Zinc or Titanium Dioxide. - Rx topical bleaching creams (i.e. hydroquinone) is a common treatment but should not be used long term.  Hydroquinones may be mixed with retinoids; steroids; Kojic Acid. - Rx Azelaic Acid is also a treatment option that is safe for pregnancy (Category B). - OTC Heliocare can be helpful in control and  prevention. - Oral Rx with Tranexamic Acid 250 mg - 650 mg po daily can be used for moderate to severe cases especially during summer (contraindications include pregnancy; lactation; hx of PE; DVT; clotting disorder; heart disease; anticoagulant use and upcoming long trips)   - Chemical peels (would need multiple for best result).  - Lasers and  Microdermabrasion may also be helpful adjunct treatments.  Will prescribe [facility] Hydroquinone 12%/kojic acid/vitamin C cream pea sized amount twice daily to the entire face for up to 3 months. This cannot be used more than 3 months due to risk of exogenous ochronosis (permanent dark spots). The patient was advised this is not covered by insurance since it is made by a compounding pharmacy. They will receive an email to check out and the medication will be mailed to their home.    Inflamed seborrheic keratosis (3) Right dorsum foot near ankle Destruction of lesion - Right dorsum foot near ankle Complexity: simple   Destruction method: cryotherapy   Informed consent: discussed and consent obtained   Timeout:  patient name, date of birth, surgical site, and procedure verified Lesion destroyed using liquid nitrogen: Yes   Region frozen until ice ball extended beyond lesion: Yes   Outcome: patient tolerated procedure well with no complications   Post-procedure details: wound care instructions given    Return in about 3 months (around 12/24/2022).  I, Ashok Cordia, CMA, am acting as scribe for Sarina Ser, MD . Documentation: I have reviewed the above documentation for accuracy and completeness, and I agree with the above.  Sarina Ser, MD

## 2022-10-02 ENCOUNTER — Encounter: Payer: Self-pay | Admitting: Dermatology

## 2022-10-04 ENCOUNTER — Ambulatory Visit: Payer: Medicare Other | Admitting: Internal Medicine

## 2022-10-06 ENCOUNTER — Encounter: Payer: Self-pay | Admitting: Student in an Organized Health Care Education/Training Program

## 2022-10-06 ENCOUNTER — Ambulatory Visit
Payer: Medicare Other | Attending: Student in an Organized Health Care Education/Training Program | Admitting: Student in an Organized Health Care Education/Training Program

## 2022-10-06 ENCOUNTER — Ambulatory Visit
Admission: RE | Admit: 2022-10-06 | Discharge: 2022-10-06 | Disposition: A | Discharge status: Home / Self Care | Payer: Medicare Other | Source: Ambulatory Visit | Attending: Student in an Organized Health Care Education/Training Program | Admitting: Student in an Organized Health Care Education/Training Program

## 2022-10-06 VITALS — BP 151/81 | HR 81 | Temp 97.3°F | Resp 13 | Ht 64.5 in | Wt 177.0 lb

## 2022-10-06 DIAGNOSIS — G894 Chronic pain syndrome: Secondary | ICD-10-CM | POA: Diagnosis not present

## 2022-10-06 DIAGNOSIS — M19012 Primary osteoarthritis, left shoulder: Secondary | ICD-10-CM | POA: Diagnosis not present

## 2022-10-06 DIAGNOSIS — G8929 Other chronic pain: Secondary | ICD-10-CM | POA: Diagnosis not present

## 2022-10-06 DIAGNOSIS — M25562 Pain in left knee: Secondary | ICD-10-CM | POA: Insufficient documentation

## 2022-10-06 DIAGNOSIS — M25561 Pain in right knee: Secondary | ICD-10-CM | POA: Insufficient documentation

## 2022-10-06 DIAGNOSIS — G5680 Other specified mononeuropathies of unspecified upper limb: Secondary | ICD-10-CM | POA: Diagnosis not present

## 2022-10-06 DIAGNOSIS — M172 Bilateral post-traumatic osteoarthritis of knee: Secondary | ICD-10-CM | POA: Insufficient documentation

## 2022-10-06 DIAGNOSIS — M25512 Pain in left shoulder: Secondary | ICD-10-CM | POA: Diagnosis not present

## 2022-10-06 MED ORDER — DIAZEPAM 5 MG PO TABS
ORAL_TABLET | ORAL | Status: AC
  Filled 2022-10-06: qty 1

## 2022-10-06 MED ORDER — IOHEXOL 180 MG/ML  SOLN
10.0000 mL | Freq: Once | INTRAMUSCULAR | Status: AC
  Administered 2022-10-06: 10 mL via INTRA_ARTICULAR
  Filled 2022-10-06: qty 20

## 2022-10-06 MED ORDER — MIDAZOLAM HCL 2 MG/2ML IJ SOLN
INTRAMUSCULAR | Status: AC
  Filled 2022-10-06: qty 2

## 2022-10-06 MED ORDER — LIDOCAINE HCL 2 % IJ SOLN
20.0000 mL | Freq: Once | INTRAMUSCULAR | Status: AC
  Administered 2022-10-06: 400 mg
  Filled 2022-10-06: qty 20

## 2022-10-06 MED ORDER — GABAPENTIN 400 MG PO CAPS: 400.0000 mg | ORAL_CAPSULE | Freq: Three times a day (TID) | ORAL | 2 refills | Status: DC

## 2022-10-06 MED ORDER — DEXAMETHASONE SODIUM PHOSPHATE 10 MG/ML IJ SOLN
10.0000 mg | Freq: Once | INTRAMUSCULAR | Status: AC
  Administered 2022-10-06: 10 mg
  Filled 2022-10-06: qty 1

## 2022-10-06 MED ORDER — DIAZEPAM 5 MG PO TABS
5.0000 mg | ORAL_TABLET | ORAL | Status: AC
  Administered 2022-10-06: 5 mg via ORAL

## 2022-10-06 MED ORDER — ROPIVACAINE HCL 2 MG/ML IJ SOLN
4.0000 mL | Freq: Once | INTRAMUSCULAR | Status: AC
  Administered 2022-10-06: 4 mL via PERINEURAL
  Filled 2022-10-06: qty 20

## 2022-10-06 NOTE — Progress Notes (Signed)
Safety precautions to be maintained throughout the outpatient stay will include: orient to surroundings, keep bed in low position, maintain call bell within reach at all times, provide assistance with transfer out of bed and ambulation.  

## 2022-10-06 NOTE — Patient Instructions (Signed)

## 2022-10-06 NOTE — Progress Notes (Signed)
PROVIDER NOTE: Interpretation of information contained herein should be left to medically-trained personnel. Specific patient instructions are provided elsewhere under "Patient Instructions" section of medical record. This document was created in part using STT-dictation technology, any transcriptional errors that may result from this process are unintentional.  Patient: Valerie Munoz Type: Established DOB: 10/03/1951 MRN: [id number] PCP: Cletis Athens, MD  Service: Procedure DOS: 10/06/2022 Setting: Ambulatory Location: Ambulatory outpatient facility Delivery: Face-to-face Provider: Gillis Santa, MD Specialty: Interventional Pain Management Specialty designation: 09 Location: Outpatient facility Ref. Prov.: Gillis Santa, MD    Primary Reason for Visit: Interventional Pain Management Treatment. CC: Shoulder Pain (Left ) and Knee Pain (Bilateral )  Procedure:           Type: Suprascapular nerve block (SSNB) #1  Laterality:  Left Level: Superior to scapular spine, lateral to supraspinatus fossa (Suprascapular notch).  Imaging: Fluoroscopic guidance         Anesthesia: Local anesthesia (1-2% Lidocaine) Anxiolysis: Valium 5 mg PO DOS: 10/06/2022  Performed by: Gillis Santa, MD  Purpose: Diagnostic/Therapeutic Indications: Shoulder pain, severe enough to impact quality of life and/or function. 1. Chronic pain of both knees   2. Chronic left shoulder pain   3. Arthritis of left glenohumeral joint   4. Chronic pain syndrome   5. Compression of left suprascapular nerve   6. Bilateral post-traumatic osteoarthritis of knee    NAS-11 score:   Pre-procedure: 5 /10   Post-procedure: 5  (ROTATING SHOULDER AROUND)/10     Target: Suprascapular nerve Location: midway between the medial border of the scapula and the acromion as it runs through the suprascapular notch. Region: Suprascapular, posterior shoulder  Approach: Percutaneous  Neuroanatomy: The suprascapular nerve is the lateral  branch of the superior trunk of the brachial plexus. It receives nerve fibers that originate in the nerve roots C5 and C6 (and sometimes C4). It is a mixed nerve, meaning that it provides both sensory and motor supply for the suprascapular region. Function: The main function of this nerve is to provide motor innervation for two muscles, the supraspinatus and infraspinatus muscles. They are part of the rotator cuff muscles. In addition, the suprascapular nerve provides a sensory supply to the joints of the scapula (glenohumeral and acromioclavicular joints). Rationale (medical necessity): procedure needed and proper for the diagnosis and/or treatment of the patient's medical symptoms and needs.  Position / Prep / Materials:  Position: Prone Materials:  Tray: Block Needle(s):  Type: Spinal  Gauge (G): 22  Length: 3.5 in.  Qty: 1 Prep solution: DuraPrep (Iodine Povacrylex [0.7% available iodine] and Isopropyl Alcohol, 74% w/w) Prep Area: Entire posterior shoulder area. From upper spine to shoulder proper (upper arm), and from lateral neck to lower tip of shoulder blade.   Pre-op H&P Assessment:  Valerie Munoz is a 71 y.o. (year old), female patient, seen today for interventional treatment. She  has a past surgical history that includes eye tumor removal (1977) and Abdominal hysterectomy. Valerie Munoz has a current medication list which includes the following prescription(s): carvedilol, cetirizine, cholecalciferol, folic acid, furosemide, gabapentin, losartan, adjust bath/shower seat, wall grab bar, multivitamin, oxcarbazepine, trazodone, and triamcinolone cream. Her primarily concern today is the Shoulder Pain (Left ) and Knee Pain (Bilateral )  Initial Vital Signs:  Pulse/HCG Rate: 84  Temp: (!) 97.3 F (36.3 C) Resp: 16 BP: (!) 176/96 SpO2: 100 %  BMI: Estimated body mass index is 29.91 kg/m as calculated from the following:   Height as of this encounter: 5' 4.5" (1.638 m).  Weight as of  this encounter: 177 lb (80.3 kg).  Risk Assessment: Allergies: Reviewed. She has No Known Allergies.  Allergy Precautions: None required Coagulopathies: Reviewed. None identified.  Blood-thinner therapy: None at this time Active Infection(s): Reviewed. None identified. Valerie Munoz is afebrile  Site Confirmation: Valerie Munoz was asked to confirm the procedure and laterality before marking the site Procedure checklist: Completed Consent: Before the procedure and under the influence of no sedative(s), amnesic(s), or anxiolytics, the patient was informed of the treatment options, risks and possible complications. To fulfill our ethical and legal obligations, as recommended by the American Medical Association's Code of Ethics, I have informed the patient of my clinical impression; the nature and purpose of the treatment or procedure; the risks, benefits, and possible complications of the intervention; the alternatives, including doing nothing; the risk(s) and benefit(s) of the alternative treatment(s) or procedure(s); and the risk(s) and benefit(s) of doing nothing. The patient was provided information about the general risks and possible complications associated with the procedure. These may include, but are not limited to: failure to achieve desired goals, infection, bleeding, organ or nerve damage, allergic reactions, paralysis, and death. In addition, the patient was informed of those risks and complications associated to the procedure, such as failure to decrease pain; infection; bleeding; organ or nerve damage with subsequent damage to sensory, motor, and/or autonomic systems, resulting in permanent pain, numbness, and/or weakness of one or several areas of the body; allergic reactions; (i.e.: anaphylactic reaction); and/or death. Furthermore, the patient was informed of those risks and complications associated with the medications. These include, but are not limited to: allergic reactions (i.e.:  anaphylactic or anaphylactoid reaction(s)); adrenal axis suppression; blood sugar elevation that in diabetics may result in ketoacidosis or comma; water retention that in patients with history of congestive heart failure may result in shortness of breath, pulmonary edema, and decompensation with resultant heart failure; weight gain; swelling or edema; medication-induced neural toxicity; particulate matter embolism and blood vessel occlusion with resultant organ, and/or nervous system infarction; and/or aseptic necrosis of one or more joints. Finally, the patient was informed that Medicine is not an exact science; therefore, there is also the possibility of unforeseen or unpredictable risks and/or possible complications that may result in a catastrophic outcome. The patient indicated having understood very clearly. We have given the patient no guarantees and we have made no promises. Enough time was given to the patient to ask questions, all of which were answered to the patient's satisfaction. Ms. Vullo has indicated that she wanted to continue with the procedure. Attestation: I, the ordering provider, attest that I have discussed with the patient the benefits, risks, side-effects, alternatives, likelihood of achieving goals, and potential problems during recovery for the procedure that I have provided informed consent. Date  Time: 10/06/2022  9:57 AM  Pre-Procedure Preparation:  Monitoring: As per clinic protocol. Respiration, ETCO2, SpO2, BP, heart rate and rhythm monitor placed and checked for adequate function Safety Precautions: Patient was assessed for positional comfort and pressure points before starting the procedure. Time-out: I initiated and conducted the "Time-out" before starting the procedure, as per protocol. The patient was asked to participate by confirming the accuracy of the "Time Out" information. Verification of the correct person, site, and procedure were performed and confirmed by  me, the nursing staff, and the patient. "Time-out" conducted as per Joint Commission's Universal Protocol (UP.01.01.01). Time: 1120  Description of Procedure:          Procedural Technique Safety Precautions: Aspiration  looking for blood return was conducted prior to all injections. At no point did we inject any substances, as a needle was being advanced. No attempts were made at seeking any paresthesias. Safe injection practices and needle disposal techniques used. Medications properly checked for expiration dates. SDV (single dose vial) medications used. Description of the Procedure: Protocol guidelines were followed. The patient was placed in position over the procedure table. The target area was identified and the area prepped in the usual manner. Skin & deeper tissues infiltrated with local anesthetic. Appropriate amount of time allowed to pass for local anesthetics to take effect. The procedure needles were then advanced to the target area. Proper needle placement secured. Negative aspiration confirmed. Solution injected in intermittent fashion, asking for systemic symptoms every 0.5cc of injectate. The needles were then removed and the area cleansed, making sure to leave some of the prepping solution back to take advantage of its long term bactericidal properties.  Vitals:   10/06/22 1003 10/06/22 1115 10/06/22 1120 10/06/22 1125  BP: (!) 176/96 (!) 163/99 (!) 150/79 (!) 151/81  Pulse: 84 80 80 81  Resp: '16 16 13 13  '$ Temp: (!) 97.3 F (36.3 C)     TempSrc: Temporal     SpO2: 100% 100% 99% 100%  Weight: 177 lb (80.3 kg)     Height: 5' 4.5" (1.638 m)       5cc solution made of 4 cc of 0.2% ropivacaine, 1 cc of Decadron 10 mg/cc. Injected after contrast confirmation   Start Time: 1121 hrs. End Time: 1124 hrs.  Imaging Guidance (Spinal):          Type of Imaging Technique: Fluoroscopy Guidance (nonSpinal) Indication(s): Assistance in needle guidance and placement for procedures  requiring needle placement in or near specific anatomical locations not easily accessible without such assistance. Exposure Time: Please see nurses notes. Contrast: Before injecting any contrast, we confirmed that the patient did not have an allergy to iodine, shellfish, or radiological contrast. Once satisfactory needle placement was completed at the desired level, radiological contrast was injected. Contrast injected under live fluoroscopy. No contrast complications. See chart for type and volume of contrast used. Fluoroscopic Guidance: I was personally present during the use of fluoroscopy. "Tunnel Vision Technique" used to obtain the best possible view of the target area. Parallax error corrected before commencing the procedure. "Direction-depth-direction" technique used to introduce the needle under continuous pulsed fluoroscopy. Once target was reached, antero-posterior, oblique, and lateral fluoroscopic projection used confirm needle placement in all planes. Images permanently stored in EMR. Interpretation: I personally interpreted the imaging intraoperatively. Adequate needle placement confirmed in multiple planes. Appropriate spread of contrast into desired area was observed. No evidence of afferent or efferent intravascular uptake. No intrathecal or subarachnoid spread observed. Permanent images saved into the patient's record.  Antibiotic Prophylaxis:   Anti-infectives (From admission, onward)    None      Indication(s): None identified  Post-operative Assessment:  Post-procedure Vital Signs:  Pulse/HCG Rate: 81  Temp: (!) 97.3 F (36.3 C) Resp: 13 BP: (!) 151/81 SpO2: 100 %  EBL: None  Complications: No immediate post-treatment complications observed by team, or reported by patient.  Note: The patient tolerated the entire procedure well. A repeat set of vitals were taken after the procedure and the patient was kept under observation following institutional policy, for this  type of procedure. Post-procedural neurological assessment was performed, showing return to baseline, prior to discharge. The patient was provided with post-procedure discharge instructions, including a  section on how to identify potential problems. Should any problems arise concerning this procedure, the patient was given instructions to immediately contact us, at any time, without hesitation. In any case, we plan to contact the patient by telephone for a follow-up status report regarding this interventional procedure.  Comments:  No additional relevant information.  Plan of Care  Orders:   B/l knee pain related to knee osteoarthritis status post intra-articular knee steroid injection with limited response.  Plan for bilateral Hyalgan Orders Placed This Encounter  Procedures   KNEE INJECTION    Indications: Knee arthralgia (pain) due to osteoarthritis (OA) Imaging: None (CPT-20610) Nursing: Please request pharmacy to have Hyalgan (Hyaluronan/Hyaluronic acid) available in the office. Comparable to Euflexxa or Triluron.    Standing Status:   Future    Standing Expiration Date:   01/06/2023    Scheduling Instructions:     Procedure: Knee injection Hyalgan (Hyaluronan/Hyaluronic acid)     Treatment No.:   1         Level: Intra-articular     Laterality: Bilateral Knee     Sedation: Patient's choice.     Timeframe: in two (2) weeks     Total Hyaluronic acid (AKA hyaluronan or hyaluronate) per injection: 40 mg (20.0 mg/2 mL). Series of 5 injections.    Order Specific Question:   Where will this procedure be performed?    Answer:   [facility]   DG PAIN [facility] C-ARM 1-60 MIN NO REPORT    Intraoperative interpretation by procedural physician at [place].    Standing Status:   Standing    Number of Occurrences:   1    Order Specific Question:   Reason for exam:    Answer:   Assistance in needle guidance and placement for procedures requiring needle placement in or near  specific anatomical locations not easily accessible without such assistance.    Medications ordered for procedure: Meds ordered this encounter  Medications   iohexol (OMNIPAQUE) 180 MG/ML injection 10 mL    Must be Myelogram-compatible. If not available, you may substitute with a water-soluble, non-ionic, hypoallergenic, myelogram-compatible radiological contrast medium.   lidocaine (XYLOCAINE) 2 % (with pres) injection 400 mg   diazepam (VALIUM) tablet 5 mg    Make sure Flumazenil is available in the pyxis when using this medication. If oversedation occurs, administer 0.2 mg IV over 15 sec. If after 45 sec no response, administer 0.2 mg again over 1 min; may repeat at 1 min intervals; not to exceed 4 doses (1 mg)   ropivacaine (PF) 2 mg/mL (0.2%) (NAROPIN) injection 4 mL   dexamethasone (DECADRON) injection 10 mg   gabapentin (NEURONTIN) 400 MG capsule    Sig: Take 1 capsule (400 mg total) by mouth 3 (three) times daily.    Dispense:  90 capsule    Refill:  2   Medications administered: We administered iohexol, lidocaine, diazepam, ropivacaine (PF) 2 mg/mL (0.2%), and dexamethasone.  See the medical record for exact dosing, route, and time of administration.  Follow-up plan:   Return in about 2 weeks (around 10/20/2022) for B/L knee hyalgan, in clinic [facility].       Bilateral intra-articular knee steroid 07/19/2022, left anterior glenohumeral joint injection 07/19/2022, left SSNB 10/06/22    Recent Visits Date Type Provider Dept  09/07/22 Office Visit Gillis Santa, MD [facility]  07/19/22 Procedure visit Gillis Santa, MD [facility]  Showing recent visits within past 90 days and meeting all other  requirements Today's Visits Date Type Provider Dept  10/06/22 Procedure visit Gillis Santa, MD [facility]  Showing today's visits and meeting all other requirements Future Appointments No visits were found meeting these conditions. Showing future appointments  within next 90 days and meeting all other requirements  Disposition: Discharge home  Discharge (Date  Time): 10/06/2022; 1129 hrs.   Primary Care Physician: Cletis Athens, MD Location: [facility] Note by: Gillis Santa, MD Date: 10/06/2022; Time: 1:00 PM  Disclaimer:  Medicine is not an exact science. The only guarantee in medicine is that nothing is guaranteed. It is important to note that the decision to proceed with this intervention was based on the information collected from the patient. The Data and conclusions were drawn from the patient's questionnaire, the interview, and the physical examination. Because the information was provided in large part by the patient, it cannot be guaranteed that it has not been purposely or unconsciously manipulated. Every effort has been made to obtain as much relevant data as possible for this evaluation. It is important to note that the conclusions that lead to this procedure are derived in large part from the available data. Always take into account that the treatment will also be dependent on availability of resources and existing treatment guidelines, considered by other Pain Management Practitioners as being common knowledge and practice, at the time of the intervention. For Medico-Legal purposes, it is also important to point out that variation in procedural techniques and pharmacological choices are the acceptable norm. The indications, contraindications, technique, and results of the above procedure should only be interpreted and judged by a Board-Certified Interventional Pain Specialist with extensive familiarity and expertise in the same exact procedure and technique.

## 2022-10-07 ENCOUNTER — Telehealth: Payer: Self-pay

## 2022-10-07 NOTE — Telephone Encounter (Signed)
Message left no answer. Instructed to call if needed.

## 2022-10-11 ENCOUNTER — Telehealth: Payer: Self-pay

## 2022-10-11 NOTE — Telephone Encounter (Signed)
Patient called office to let you know she is unable to afford lightening cream from [facility]. aw

## 2022-10-12 ENCOUNTER — Telehealth: Payer: Self-pay | Admitting: *Deleted

## 2022-10-12 NOTE — Telephone Encounter (Signed)
Patient advised of information per Dr. Kowalski. aw 

## 2022-10-12 NOTE — Telephone Encounter (Signed)
Error

## 2022-10-14 NOTE — Telephone Encounter (Signed)
Have been trying to get surgical clearance for patient from PCP. Patient does not see a cardiology.   Patient has AICD (automatic cardioverter/defibrillator).  Sent several copies of clearance to PCP with no responses.

## 2022-10-15 ENCOUNTER — Encounter: Payer: Self-pay | Admitting: *Deleted

## 2022-10-18 ENCOUNTER — Telehealth: Payer: Self-pay | Admitting: *Deleted

## 2022-10-18 NOTE — Telephone Encounter (Signed)
Patient called office stating that she was just contacted on Friday 10/15/2022 from PCP regarding procedure clearance.  She have an appointment with her PCP on 10/21/2022 regarding this.  We had reschedule her colonoscopy to 11/16/2022, already spoken to [facility] endo unit.  New instructions will be mail out for patient.

## 2022-10-21 ENCOUNTER — Encounter: Payer: Self-pay | Admitting: Nurse Practitioner

## 2022-10-21 ENCOUNTER — Ambulatory Visit (INDEPENDENT_AMBULATORY_CARE_PROVIDER_SITE_OTHER): Payer: Medicare Other | Admitting: Nurse Practitioner

## 2022-10-21 VITALS — BP 153/90 | HR 87 | Temp 97.5°F | Resp 16 | Ht 64.5 in | Wt 179.3 lb

## 2022-10-21 DIAGNOSIS — Z95810 Presence of automatic (implantable) cardiac defibrillator: Secondary | ICD-10-CM

## 2022-10-21 DIAGNOSIS — I10 Essential (primary) hypertension: Secondary | ICD-10-CM

## 2022-10-21 DIAGNOSIS — Z01818 Encounter for other preprocedural examination: Secondary | ICD-10-CM

## 2022-10-21 MED ORDER — CARVEDILOL 25 MG PO TABS: 25.0000 mg | ORAL_TABLET | Freq: Two times a day (BID) | ORAL | 1 refills | Status: AC

## 2022-10-21 NOTE — Progress Notes (Signed)
Established Patient Office Visit  Subjective:  Patient ID: Valerie Munoz, female    DOB: 1951/01/12  Age: 71 y.o. MRN: [id number]  CC:  Chief Complaint  Patient presents with   Pre-op Exam    Pre op colonoscopy 11/16/22. Did not take her BP med today.      HPI  Valerie Munoz presents for pre-op clearance. She has colonoscopy of 11/16/22. She had a AICD since last 15 years. She had replaced it 2 years ago in [place]. She need to schedule and appointment with a cardiologist to get the AICD checked.   Patient state he was out of blood pressure medication and have not take carvedilol for 2 weeks. She is taking losartan.    HPI   Past Medical History:  Diagnosis Date   Gout    Hospitalized for tx of Left foot 2017, per pt.   Heart disease    Hypertension    Migraine headache    Neuropathy     Past Surgical History:  Procedure Laterality Date   ABDOMINAL HYSTERECTOMY     eye tumor removal  1977    No family history on file.  Social History   Socioeconomic History   Marital status: Widowed    Spouse name: Not on file   Number of children: Not on file   Years of education: 14   Highest education level: Associate degree: academic program  Occupational History   Not on file  Tobacco Use   Smoking status: Never   Smokeless tobacco: Never  Substance and Sexual Activity   Alcohol use: Not Currently   Drug use: Never   Sexual activity: Not Currently  Other Topics Concern   Not on file  Social History Narrative   Not on file   Social Determinants of Health   Financial Resource Strain: Low Risk  (04/30/2022)   Overall Financial Resource Strain (CARDIA)    Difficulty of Paying Living Expenses: Not very hard  Food Insecurity: Food Insecurity Present (04/30/2022)   Hunger Vital Sign    Worried About Running Out of Food in the Last Year: Sometimes true    Ran Out of Food in the Last Year: Sometimes true  Transportation Needs: No Transportation Needs (04/30/2022)    PRAPARE - Hydrologist (Medical): No    Lack of Transportation (Non-Medical): No  Physical Activity: Inactive (04/30/2022)   Exercise Vital Sign    Days of Exercise per Week: 0 days    Minutes of Exercise per Session: 0 min  Stress: No Stress Concern Present (04/30/2022)   [place]    Feeling of Stress : Not at all  Social Connections: Unknown (04/30/2022)   Social Connection and Isolation Panel [NHANES]    Frequency of Communication with Friends and Family: Patient refused    Frequency of Social Gatherings with Friends and Family: Patient refused    Attends Religious Services: Patient refused    Active Member of Clubs or Organizations: Patient refused    Attends Archivist Meetings: Patient refused    Marital Status: Widowed  Intimate Partner Violence: Not At Risk (04/30/2022)   Humiliation, Afraid, Rape, and Kick questionnaire    Fear of Current or Ex-Partner: No    Emotionally Abused: No    Physically Abused: No    Sexually Abused: No     Outpatient Medications Prior to Visit  Medication Sig Dispense Refill   cetirizine (ZYRTEC) 10 MG  tablet Take 1 tablet (10 mg total) by mouth daily. 90 tablet 2   cholecalciferol (VITAMIN D3) 25 MCG (1000 UNIT) tablet Take 1,000 Units by mouth daily.     folic acid (FOLVITE) 1 MG tablet Take 1 mg by mouth daily.     furosemide (LASIX) 20 MG tablet Take 1 tablet (20 mg total) by mouth every other day. 90 tablet 1   gabapentin (NEURONTIN) 400 MG capsule Take 1 capsule (400 mg total) by mouth 3 (three) times daily. 90 capsule 2   losartan (COZAAR) 50 MG tablet Take 1 tablet (50 mg total) by mouth daily. 90 tablet 1   Misc. Devices (ADJUST BATH/SHOWER SEAT) MISC 1 each by Does not apply route daily as needed. 1 each 0   Misc. Devices (WALL GRAB BAR) MISC 1 each by Does not apply route as directed. 1 each 0   multivitamin (RENA-VIT) TABS tablet  Take 1 tablet by mouth daily.     OXcarbazepine (TRILEPTAL) 150 MG tablet Take 1 tablet (150 mg total) by mouth daily. 90 tablet 1   traZODone (DESYREL) 50 MG tablet Take 1 tablet (50 mg total) by mouth every evening. 90 tablet 1   triamcinolone cream (KENALOG) 0.1 % Apply 1 Application topically 2 (two) times daily. 30 g 4   carvedilol (COREG) 25 MG tablet Take 1 tablet (25 mg total) by mouth 2 (two) times daily. 180 tablet 1   No facility-administered medications prior to visit.    No Known Allergies  ROS Review of Systems  Constitutional: Negative.   HENT: Negative.    Eyes: Negative.   Respiratory:  Negative for chest tightness and shortness of breath.   Cardiovascular: Negative.   Gastrointestinal: Negative.   Genitourinary: Negative.   Musculoskeletal:  Positive for arthralgias and joint swelling.  Neurological:  Negative for dizziness, facial asymmetry and headaches.  Psychiatric/Behavioral:  Negative for agitation, behavioral problems and confusion.       Objective:    Physical Exam  BP (!) 153/90   Pulse 87   Temp (!) 97.5 F (36.4 C) (Temporal)   Resp 16   Ht 5' 4.5" (1.638 m)   Wt 179 lb 4.8 oz (81.3 kg)   SpO2 95%   BMI 30.30 kg/m  Wt Readings from Last 3 Encounters:  11/01/22 179 lb (81.2 kg)  10/21/22 179 lb 4.8 oz (81.3 kg)  10/06/22 177 lb (80.3 kg)     Health Maintenance  Topic Date Due   COVID-19 Vaccine (1) Never done   MAMMOGRAM  Never done   Pneumonia Vaccine 59+ Years old (1 - PCV) Never done   COLONOSCOPY (Pts 45-22yr Insurance coverage will need to be confirmed)  01/21/2023 (Originally 09/26/1996)   Zoster Vaccines- Shingrix (1 of 2) 01/21/2023 (Originally 09/26/1970)   DEXA SCAN  09/01/2023 (Originally 09/26/2016)   Hepatitis C Screening  09/01/2023 (Originally 09/26/1969)   Medicare Annual Wellness (AWV)  05/01/2023   DTaP/Tdap/Td (2 - Td or Tdap) 10/22/2031   INFLUENZA VACCINE  Completed   HPV VACCINES  Aged Out    There are no  preventive care reminders to display for this patient.  No results found for: "TSH" Lab Results  Component Value Date   WBC 9.3 03/24/2022   HGB 11.3 (L) 03/24/2022   HCT 35.5 (L) 03/24/2022   MCV 94.4 03/24/2022   PLT 203 03/24/2022   Lab Results  Component Value Date   NA 137 03/24/2022   K 4.2 03/24/2022   CO2 23 03/24/2022  GLUCOSE 105 (H) 03/24/2022   BUN 23 03/24/2022   CREATININE 1.72 (H) 03/24/2022   CALCIUM 7.8 (L) 03/24/2022   ANIONGAP 9 03/24/2022   No results found for: "CHOL" No results found for: "HDL" No results found for: "LDLCALC" No results found for: "TRIG" No results found for: "CHOLHDL" No results found for: "HGBA1C"    Assessment & Plan:   Problem List Items Addressed This Visit       Cardiovascular and Mediastinum   Hypertension    Patient BP  Vitals:   10/21/22 1139 10/21/22 1142  BP: (!) 146/93 (!) 153/90    in the office 11/10/22  Advised pt to follow a low sodium and heart healthy diet. Continue carvedilol and  losartan. We will continue to monitor if needed would adjust medication. Encourage patient to check blood pressure at home and bring to the next appointment       Relevant Medications   carvedilol (COREG) 25 MG tablet     Other   AICD (automatic cardioverter/defibrillator) present   Relevant Orders   Ambulatory referral to Cardiology   Pre-op evaluation - Primary    Patient has AICD. Would refer her to cardiologist for preop clearance      Relevant Orders   EKG 12-Lead   Ambulatory referral to Cardiology     Meds ordered this encounter  Medications   carvedilol (COREG) 25 MG tablet    Sig: Take 1 tablet (25 mg total) by mouth 2 (two) times daily.    Dispense:  180 tablet    Refill:  1     Follow-up: No follow-ups on file.    [name], NP

## 2022-11-01 ENCOUNTER — Ambulatory Visit
Payer: Medicare Other | Attending: Student in an Organized Health Care Education/Training Program | Admitting: Student in an Organized Health Care Education/Training Program

## 2022-11-01 ENCOUNTER — Encounter: Payer: Self-pay | Admitting: Student in an Organized Health Care Education/Training Program

## 2022-11-01 DIAGNOSIS — M172 Bilateral post-traumatic osteoarthritis of knee: Secondary | ICD-10-CM | POA: Diagnosis not present

## 2022-11-01 DIAGNOSIS — G8929 Other chronic pain: Secondary | ICD-10-CM | POA: Insufficient documentation

## 2022-11-01 DIAGNOSIS — M25561 Pain in right knee: Secondary | ICD-10-CM | POA: Insufficient documentation

## 2022-11-01 DIAGNOSIS — M25562 Pain in left knee: Secondary | ICD-10-CM | POA: Insufficient documentation

## 2022-11-01 MED ORDER — DIAZEPAM 5 MG PO TABS
ORAL_TABLET | ORAL | Status: AC
  Filled 2022-11-01: qty 1

## 2022-11-01 MED ORDER — SODIUM HYALURONATE (VISCOSUP) 16.8 MG/2ML IX SOSY
16.8000 mg | PREFILLED_SYRINGE | Freq: Once | INTRA_ARTICULAR | Status: AC
  Administered 2022-11-01: 16.8 mg via INTRA_ARTICULAR

## 2022-11-01 MED ORDER — LIDOCAINE HCL (PF) 2 % IJ SOLN
INTRAMUSCULAR | Status: AC
  Filled 2022-11-01: qty 5

## 2022-11-01 NOTE — Progress Notes (Signed)
Safety precautions to be maintained throughout the outpatient stay will include: orient to surroundings, keep bed in low position, maintain call bell within reach at all times, provide assistance with transfer out of bed and ambulation.  

## 2022-11-01 NOTE — Progress Notes (Signed)
PROVIDER NOTE: Interpretation of information contained herein should be left to medically-trained personnel. Specific patient instructions are provided elsewhere under "Patient Instructions" section of medical record. This document was created in part using STT-dictation technology, any transcriptional errors that may result from this process are unintentional.  Patient: Valerie Munoz Type: Established DOB: 1951-04-20 MRN: [id number] PCP: Cletis Athens, MD  Service: Procedure DOS: 11/01/2022 Setting: Ambulatory Location: Ambulatory outpatient facility Delivery: Face-to-face Provider: Gillis Santa, MD Specialty: Interventional Pain Management Specialty designation: 09 Location: Outpatient facility Ref. Prov.: Gillis Santa, MD    Primary Reason for Visit: Interventional Pain Management Treatment. CC: Knee Pain (both)   Procedure:           Type: Gelsyn-3 Intra-articular Knee Injection #1  Laterality: Bilateral (-50) Level/approach: Medial Imaging guidance: None required ([id number]) Anesthesia: Local anesthesia (1-2% Lidocaine) DOS: 11/01/2022  Performed by: Gillis Santa, MD  Purpose: Diagnostic/Therapeutic Indications: Knee arthralgia associated to osteoarthritis of the knee 1. Chronic pain of both knees   2. Bilateral post-traumatic osteoarthritis of knee    NAS-11 score:   Pre-procedure: 8 /10   Post-procedure: 8 /10     Pre-Procedure Preparation  Monitoring: As per clinic protocol.  Risk Assessment: Vitals:  [id number] body mass index is 29.79 kg/m as calculated from the following:   Height as of this encounter: '5\' 5"'$  (1.651 m).   Weight as of this encounter: 179 lb (81.2 kg)., Rate:62 , BP:125/87, Resp: , Temp:(!) 97.1 F (36.2 C), SpO2:100 %  Allergies: She has No Known Allergies.  Precautions: No additional precautions required  Blood-thinner(s): None at this time  Coagulopathies: Reviewed. None identified.   Active Infection(s): Reviewed. None identified. Ms.  Munoz is afebrile   Location setting: Exam room Position: Sitting w/ knee bent 90 degrees Safety Precautions: Patient was assessed for positional comfort and pressure points before starting the procedure. Prepping solution: DuraPrep (Iodine Povacrylex [0.7% available iodine] and Isopropyl Alcohol, 74% w/w) Prep Area: Entire knee region Approach: percutaneous, just above the tibial plateau, lateral to the infrapatellar tendon. Intended target: Intra-articular knee space Materials: Tray: Block Needle(s): Regular Qty: 1/side Length: 1.5-inch Gauge: 25G   Meds ordered this encounter  Medications   sodium hyaluronate (viscosup) (GELSYN-3) intra-articular injection 16.8 mg    Do not substitute. Deliver to facility day before procedure.   sodium hyaluronate (viscosup) (GELSYN-3) intra-articular injection 16.8 mg    Do not substitute. Deliver to facility day before procedure.    No orders of the defined types were placed in this encounter.    Time-out: 1143 I initiated and conducted the "Time-out" before starting the procedure, as per protocol. The patient was asked to participate by confirming the accuracy of the "Time Out" information. Verification of the correct person, site, and procedure were performed and confirmed by me, the nursing staff, and the patient. "Time-out" conducted as per Joint Commission's Universal Protocol (UP.01.01.01). Procedure checklist: Completed   H&P (Pre-op  Assessment)  Valerie Munoz is a 71 y.o. (year old), female patient, seen today for interventional treatment. She  has a past surgical history that includes eye tumor removal (1977) and Abdominal hysterectomy. Valerie Munoz has a current medication list which includes the following prescription(s): carvedilol, cetirizine, cholecalciferol, folic acid, furosemide, gabapentin, losartan, adjust bath/shower seat, wall grab bar, multivitamin, oxcarbazepine, trazodone, and triamcinolone cream. Her primarily concern today is  the Knee Pain (both)  She has No Known Allergies.   Last encounter: My last encounter with her was on 10/06/2022. Pertinent problems: Valerie Munoz has Neuropathic pain  of both legs; Chronic left shoulder pain; Lumbar degenerative disc disease; Lumbar facet arthropathy; Arthritis of left glenohumeral joint; Chronic pain syndrome; and Bilateral post-traumatic osteoarthritis of knee on their pertinent problem list. Pain Assessment: Severity of Chronic pain is reported as a 8 /10. Location: Knee Left, Right/Denies. Onset: More than a month ago. Quality: Aching, Constant, Stabbing, Sharp. Timing: Constant. Modifying factor(s): sitting down or laying down with leg elevated on pillow. Vitals:  height is '5\' 5"'$  (1.651 m) and weight is 179 lb (81.2 kg). Her temperature is 97.1 F (36.2 C) (abnormal). Her blood pressure is 125/87 and her pulse is 62. Her oxygen saturation is 100%.   Reason for encounter: "interventional pain management therapy due pain of at least four (4) weeks in duration, with failure to respond and/or inability to tolerate more conservative care.   Site Confirmation: Valerie Munoz was asked to confirm the procedure and laterality before marking the site.  Consent: Before the procedure and under the influence of no sedative(s), amnesic(s), or anxiolytics, the patient was informed of the treatment options, risks and possible complications. To fulfill our ethical and legal obligations, as recommended by the American Medical Association's Code of Ethics, I have informed the patient of my clinical impression; the nature and purpose of the treatment or procedure; the risks, benefits, and possible complications of the intervention; the alternatives, including doing nothing; the risk(s) and benefit(s) of the alternative treatment(s) or procedure(s); and the risk(s) and benefit(s) of doing nothing. The patient was provided information about the general risks and possible complications associated with the  procedure. These may include, but are not limited to: failure to achieve desired goals, infection, bleeding, organ or nerve damage, allergic reactions, paralysis, and death. In addition, the patient was informed of those risks and complications associated to Spine-related procedures, such as failure to decrease pain; infection (i.e.: Meningitis, epidural or intraspinal abscess); bleeding (i.e.: epidural hematoma, subarachnoid hemorrhage, or any other type of intraspinal or peri-dural bleeding); organ or nerve damage (i.e.: Any type of peripheral nerve, nerve root, or spinal cord injury) with subsequent damage to sensory, motor, and/or autonomic systems, resulting in permanent pain, numbness, and/or weakness of one or several areas of the body; allergic reactions; (i.e.: anaphylactic reaction); and/or death. Furthermore, the patient was informed of those risks and complications associated with the medications. These include, but are not limited to: allergic reactions (i.e.: anaphylactic or anaphylactoid reaction(s)); adrenal axis suppression; blood sugar elevation that in diabetics may result in ketoacidosis or comma; water retention that in patients with history of congestive heart failure may result in shortness of breath, pulmonary edema, and decompensation with resultant heart failure; weight gain; swelling or edema; medication-induced neural toxicity; particulate matter embolism and blood vessel occlusion with resultant organ, and/or nervous system infarction; and/or aseptic necrosis of one or more joints. Finally, the patient was informed that Medicine is not an exact science; therefore, there is also the possibility of unforeseen or unpredictable risks and/or possible complications that may result in a catastrophic outcome. The patient indicated having understood very clearly. We have given the patient no guarantees and we have made no promises. Enough time was given to the patient to ask questions, all of  which were answered to the patient's satisfaction. Ms. Kleinman has indicated that she wanted to continue with the procedure. Attestation: I, the ordering provider, attest that I have discussed with the patient the benefits, risks, side-effects, alternatives, likelihood of achieving goals, and potential problems during recovery for the procedure that I  have provided informed consent.  Date  Time: 11/01/2022 11:09 AM   Prophylactic antibiotics  Anti-infectives (From admission, onward)    None      Indication(s): None identified   Description of procedure   Start Time: 1143 hrs  Local Anesthesia: Once the patient was positioned, prepped, and time-out was completed. The target area was identified located. The skin was marked with an approved surgical skin marker. Once marked, the skin (epidermis, dermis, and hypodermis), and deeper tissues (fat, connective tissue and muscle) were infiltrated with a small amount of a short-acting local anesthetic, loaded on a 10cc syringe with a 25G, 1.5-in  Needle. An appropriate amount of time was allowed for local anesthetics to take effect before proceeding to the next step. Local Anesthetic: Lidocaine 1-2% The unused portion of the local anesthetic was discarded in the proper designated containers. Safety Precautions: Aspiration looking for blood return was conducted prior to all injections. At no point did I inject any substances, as a needle was being advanced. Before injecting, the patient was told to immediately notify me if she was experiencing any new onset of "ringing in the ears, or metallic taste in the mouth". No attempts were made at seeking any paresthesias. Safe injection practices and needle disposal techniques used. Medications properly checked for expiration dates. SDV (single dose vial) medications used. After the completion of the procedure, all disposable equipment used was discarded in the proper designated medical waste containers.   Technical description: Protocol guidelines were followed. After positioning, the target area was identified and prepped in the usual manner. Skin & deeper tissues infiltrated with local anesthetic. Appropriate amount of time allowed to pass for local anesthetics to take effect. Proper needle placement secured. Once satisfactory needle placement was confirmed, I proceeded to inject the desired solution in slow, incremental fashion, intermittently assessing for discomfort or any signs of abnormal or undesired spread of substance. Once completed, the needle was removed and disposed of, as per hospital protocols. The area was cleaned, making sure to leave some of the prepping solution back to take advantage of its long term bactericidal properties.  Aspiration:  Negative          Vitals:   11/01/22 1114  BP: 125/87  Pulse: 62  Temp: (!) 97.1 F (36.2 C)  SpO2: 100%  Weight: 179 lb (81.2 kg)  Height: '5\' 5"'$  (1.651 m)    End Time: 1144 hrs   Imaging guidance  Imaging-assisted Technique: None required. Indication(s): N/A Exposure Time: N/A Contrast: None Fluoroscopic Guidance: N/A Ultrasound Guidance: N/A Interpretation: N/A   Post-op assessment  Post-procedure Vital Signs:  Pulse/HCG Rate: 62  Temp: (!) 97.1 F (36.2 C) Resp:   BP: 125/87 SpO2: 100 %  EBL: None  Complications: No immediate post-treatment complications observed by team, or reported by patient.  Note: The patient tolerated the entire procedure well. A repeat set of vitals were taken after the procedure and the patient was kept under observation following institutional policy, for this type of procedure. Post-procedural neurological assessment was performed, showing return to baseline, prior to discharge. The patient was provided with post-procedure discharge instructions, including a section on how to identify potential problems. Should any problems arise concerning this procedure, the patient was given  instructions to immediately contact us, at any time, without hesitation. In any case, we plan to contact the patient by telephone for a follow-up status report regarding this interventional procedure.  Comments:  No additional relevant information.   Plan of care  Medications administered: We administered sodium hyaluronate (viscosup) and sodium hyaluronate (viscosup).  Follow-up plan:   Return in about 29 days (around 11/30/2022) for Post Procedure Evaluation, in person.      Bilateral intra-articular knee steroid 07/19/2022, left anterior glenohumeral joint injection 07/19/2022, left SSNB 10/06/22, bilateral Gelsyn-3 injection 11/01/2022.     Recent Visits Date Type Provider Dept  10/06/22 Procedure visit Gillis Santa, MD [facility]  09/07/22 Office Visit Gillis Santa, MD [facility]  Showing recent visits within past 90 days and meeting all other requirements Today's Visits Date Type Provider Dept  11/01/22 Procedure visit Gillis Santa, MD [facility]  Showing today's visits and meeting all other requirements Future Appointments No visits were found meeting these conditions. Showing future appointments within next 90 days and meeting all other requirements   Disposition: Discharge home  Discharge (Date  Time): 11/01/2022; 1200 hrs.   Primary Care Physician: Cletis Athens, MD Location: [facility] Note by: Gillis Santa, MD Date: 11/01/2022; Time: 11:53 AM  DISCLAIMER: Medicine is not an exact science. It has no guarantees or warranties. The decision to proceed with this intervention was based on the information collected from the patient. Conclusions were drawn from the patient's questionnaire, interview, and examination. Because information was provided in large part by the patient, it cannot be guaranteed that it has not been purposely or unconsciously manipulated or altered. Every effort has been made to obtain  as much accurate, relevant, available data as possible. Always take into account that the treatment will also be dependent on availability of resources and existing treatment guidelines, considered by other Pain Management Specialists as being common knowledge and practice, at the time of the intervention. It is also important to point out that variation in procedural techniques and pharmacological choices are the acceptable norm. For Medico-Legal review purposes, the indications, contraindications, technique, and results of the these procedures should only be evaluated, judged and interpreted by a Board-Certified Interventional Pain Specialist with extensive familiarity and expertise in the same exact procedure and technique.

## 2022-11-01 NOTE — Patient Instructions (Signed)

## 2022-11-02 ENCOUNTER — Telehealth: Payer: Self-pay | Admitting: *Deleted

## 2022-11-02 NOTE — Telephone Encounter (Signed)
Attempted to call for post procedure follow-up. Unable to leave a message, mailbox full.

## 2022-11-10 ENCOUNTER — Encounter: Payer: Self-pay | Admitting: Nurse Practitioner

## 2022-11-10 DIAGNOSIS — Z01818 Encounter for other preprocedural examination: Secondary | ICD-10-CM | POA: Insufficient documentation

## 2022-11-10 DIAGNOSIS — I10 Essential (primary) hypertension: Secondary | ICD-10-CM | POA: Insufficient documentation

## 2022-11-10 NOTE — Assessment & Plan Note (Signed)
Patient has AICD. Would refer her to cardiologist for preop clearance

## 2022-11-10 NOTE — Assessment & Plan Note (Addendum)
Patient BP  Vitals:   10/21/22 1139 10/21/22 1142  BP: (!) 146/93 (!) 153/90    in the office 11/10/22  Advised pt to follow a low sodium and heart healthy diet. Continue carvedilol and  losartan. We will continue to monitor if needed would adjust medication. Encourage patient to check blood pressure at home and bring to the next appointment

## 2022-11-16 ENCOUNTER — Ambulatory Visit: Payer: Medicare Other | Admitting: Certified Registered"

## 2022-11-16 ENCOUNTER — Ambulatory Visit
Admission: RE | Admit: 2022-11-16 | Discharge: 2022-11-16 | Disposition: A | Discharge status: Home / Self Care | Payer: Medicare Other | Source: Ambulatory Visit | Attending: Gastroenterology | Admitting: Gastroenterology

## 2022-11-16 ENCOUNTER — Encounter: Payer: Self-pay | Admitting: Gastroenterology

## 2022-11-16 ENCOUNTER — Encounter
Admission: RE | Disposition: A | Discharge status: Home / Self Care | Payer: Self-pay | Source: Ambulatory Visit | Attending: Gastroenterology

## 2022-11-16 ENCOUNTER — Other Ambulatory Visit: Payer: Self-pay

## 2022-11-16 DIAGNOSIS — I10 Essential (primary) hypertension: Secondary | ICD-10-CM | POA: Insufficient documentation

## 2022-11-16 DIAGNOSIS — K5790 Diverticulosis of intestine, part unspecified, without perforation or abscess without bleeding: Secondary | ICD-10-CM | POA: Diagnosis not present

## 2022-11-16 DIAGNOSIS — K5730 Diverticulosis of large intestine without perforation or abscess without bleeding: Secondary | ICD-10-CM | POA: Insufficient documentation

## 2022-11-16 DIAGNOSIS — D123 Benign neoplasm of transverse colon: Secondary | ICD-10-CM | POA: Insufficient documentation

## 2022-11-16 DIAGNOSIS — D122 Benign neoplasm of ascending colon: Secondary | ICD-10-CM | POA: Insufficient documentation

## 2022-11-16 DIAGNOSIS — Z1211 Encounter for screening for malignant neoplasm of colon: Secondary | ICD-10-CM | POA: Diagnosis not present

## 2022-11-16 DIAGNOSIS — K635 Polyp of colon: Secondary | ICD-10-CM | POA: Diagnosis not present

## 2022-11-16 HISTORY — PX: COLONOSCOPY WITH PROPOFOL: SHX5780

## 2022-11-16 SURGERY — COLONOSCOPY WITH PROPOFOL
Anesthesia: General

## 2022-11-16 MED ORDER — PROPOFOL 500 MG/50ML IV EMUL
INTRAVENOUS | Status: DC | PRN
  Administered 2022-11-16: 150 ug/kg/min via INTRAVENOUS

## 2022-11-16 MED ORDER — PROPOFOL 10 MG/ML IV BOLUS
INTRAVENOUS | Status: DC | PRN
  Administered 2022-11-16: 60 mg via INTRAVENOUS
  Administered 2022-11-16: 20 mg via INTRAVENOUS

## 2022-11-16 MED ORDER — LIDOCAINE HCL (CARDIAC) PF 100 MG/5ML IV SOSY
PREFILLED_SYRINGE | INTRAVENOUS | Status: DC | PRN
  Administered 2022-11-16: 50 mg via INTRAVENOUS

## 2022-11-16 MED ORDER — SODIUM CHLORIDE 0.9 % IV SOLN: INTRAVENOUS | Status: DC

## 2022-11-16 NOTE — Transfer of Care (Signed)
Immediate Anesthesia Transfer of Care Note  Patient: Valerie Munoz  Procedure(s) Performed: COLONOSCOPY WITH PROPOFOL  Patient Location: PACU and Endoscopy Unit  Anesthesia Type:General  Level of Consciousness: drowsy and patient cooperative  Airway & Oxygen Therapy: Patient Spontanous Breathing  Post-op Assessment: Report given to RN and Post -op Vital signs reviewed and stable  Post vital signs: Reviewed and stable  Last Vitals:  Vitals Value Taken Time  BP 116/75 11/16/22 1217  Temp    Pulse 80 11/16/22 1218  Resp    SpO2 98 % 11/16/22 1218  Vitals shown include unvalidated device data.  Last Pain:  Vitals:   11/16/22 1116  TempSrc: Temporal  PainSc: 0-No pain         Complications: No notable events documented.

## 2022-11-16 NOTE — Anesthesia Preprocedure Evaluation (Signed)
Anesthesia Evaluation  Patient identified by MRN, date of birth, ID band Patient awake    Reviewed: Allergy & Precautions, H&P , NPO status , Patient's Chart, lab work & pertinent test results, reviewed documented beta blocker date and time   History of Anesthesia Complications Negative for: history of anesthetic complications  Airway Mallampati: II  TM Distance: >3 FB Neck ROM: full    Dental no notable dental hx. (+) Upper Dentures, Lower Dentures, Dental Advidsory Given   Pulmonary neg pulmonary ROS   Pulmonary exam normal breath sounds clear to auscultation       Cardiovascular Exercise Tolerance: Good hypertension, (-) angina (-) Past MI and (-) Cardiac Stents Normal cardiovascular exam+ dysrhythmias + Cardiac Defibrillator (-) Valvular Problems/Murmurs Rhythm:regular Rate:Normal     Neuro/Psych negative neurological ROS  negative psych ROS   GI/Hepatic Neg liver ROS,GERD  ,,  Endo/Other  negative endocrine ROS    Renal/GU negative Renal ROS  negative genitourinary   Musculoskeletal   Abdominal   Peds  Hematology negative hematology ROS (+)   Anesthesia Other Findings Past Medical History: No date: Gout     Comment:  Hospitalized for tx of Left foot 2017, per pt. No date: Heart disease No date: Hypertension No date: Migraine headache No date: Neuropathy   Reproductive/Obstetrics negative OB ROS                             Anesthesia Physical Anesthesia Plan  ASA: 2  Anesthesia Plan: General   Post-op Pain Management:    Induction: Intravenous  PONV Risk Score and Plan: 3 and Propofol infusion and TIVA  Airway Management Planned: Natural Airway and Nasal Cannula  Additional Equipment:   Intra-op Plan:   Post-operative Plan:   Informed Consent: I have reviewed the patients History and Physical, chart, labs and discussed the procedure including the risks, benefits  and alternatives for the proposed anesthesia with the patient or authorized representative who has indicated his/her understanding and acceptance.     Dental Advisory Given  Plan Discussed with: Anesthesiologist, CRNA and Surgeon  Anesthesia Plan Comments:        Anesthesia Quick Evaluation

## 2022-11-16 NOTE — Op Note (Signed)
[facility] Gastroenterology Patient Name: Valerie Munoz Procedure Date: 11/16/2022 11:42 AM MRN: [id number] Account #: [id number] Date of Birth: 1951-04-10 Admit Type: Outpatient Age: 71 Room: [facility] ENDO ROOM 3 Gender: Female Note Status: Finalized Instrument Name: Jasper Riling [id number] Procedure:             Colonoscopy Indications:           Screening for colorectal malignant neoplasm Providers:             Lin Landsman MD, MD Referring MD:          Cletis Athens, MD (Referring MD) Medicines:             General Anesthesia Complications:         No immediate complications. Estimated blood loss: None. Procedure:             Pre-Anesthesia Assessment:                        - Prior to the procedure, a History and Physical was                         performed, and patient medications and allergies were                         reviewed. The patient is competent. The risks and                         benefits of the procedure and the sedation options and                         risks were discussed with the patient. All questions                         were answered and informed consent was obtained.                         Patient identification and proposed procedure were                         verified by the physician, the nurse, the                         anesthesiologist, the anesthetist and the technician                         in the pre-procedure area in the procedure room in the                         endoscopy suite. Mental Status Examination: alert and                         oriented. Airway Examination: normal oropharyngeal                         airway and neck mobility. Respiratory Examination:                         clear to auscultation. CV Examination: normal.  Prophylactic Antibiotics: The patient does not require                         prophylactic antibiotics. Prior Anticoagulants: The                          patient has taken no anticoagulant or antiplatelet                         agents. ASA Grade Assessment: II - A patient with mild                         systemic disease. After reviewing the risks and                         benefits, the patient was deemed in satisfactory                         condition to undergo the procedure. The anesthesia                         plan was to use general anesthesia. Immediately prior                         to administration of medications, the patient was                         re-assessed for adequacy to receive sedatives. The                         heart rate, respiratory rate, oxygen saturations,                         blood pressure, adequacy of pulmonary ventilation, and                         response to care were monitored throughout the                         procedure. The physical status of the patient was                         re-assessed after the procedure.                        After obtaining informed consent, the colonoscope was                         passed under direct vision. Throughout the procedure,                         the patient's blood pressure, pulse, and oxygen                         saturations were monitored continuously. The                         Colonoscope was introduced through the anus and  advanced to the the cecum, identified by appendiceal                         orifice and ileocecal valve. The colonoscopy was                         performed with moderate difficulty due to multiple                         diverticula in the colon. Successful completion of the                         procedure was aided by withdrawing the scope and                         replacing with the pediatric colonoscope. The patient                         tolerated the procedure well. The quality of the bowel                         preparation was unsatisfactory. The ileocecal valve,                          appendiceal orifice, and rectum were photographed. Findings:      The perianal and digital rectal examinations were normal. Pertinent       negatives include normal sphincter tone and no palpable rectal lesions.      The terminal ileum appeared normal.      An 11 mm polyp was found in the ascending colon. The polyp was sessile.       The polyp was removed with a hot snare. Resection and retrieval were       complete. Estimated blood loss: none.      Two sessile polyps were found in the transverse colon. The polyps were 4       to 5 mm in size. These polyps were removed with a cold snare. Resection       and retrieval were complete. Estimated blood loss: none.      Multiple diverticula were found in the recto-sigmoid colon and sigmoid       colon.      The retroflexed view of the distal rectum and anal verge was normal and       showed no anal or rectal abnormalities. Impression:            - Preparation of the colon was unsatisfactory.                        - The examined portion of the ileum was normal.                        - One 11 mm polyp in the ascending colon, removed with                         a hot snare. Resected and retrieved.                        - Two 4 to 5 mm polyps in the transverse  colon,                         removed with a cold snare. Resected and retrieved.                        - Diverticulosis in the recto-sigmoid colon and in the                         sigmoid colon.                        - The distal rectum and anal verge are normal on                         retroflexion view. Recommendation:        - Discharge patient to home (with escort).                        - Resume previous diet today.                        - Continue present medications.                        - Await pathology results.                        - Repeat colonoscopy tomorrow with repeat prep or                         within 3 months with 2 day prep because the bowel                          preparation was suboptimal. Procedure Code(s):     --- Professional ---                        [phone], Colonoscopy, flexible; with removal of                         tumor(s), polyp(s), or other lesion(s) by snare                         technique Diagnosis Code(s):     --- Professional ---                        Z12.11, Encounter for screening for malignant neoplasm                         of colon                        D12.2, Benign neoplasm of ascending colon                        D12.3, Benign neoplasm of transverse colon (hepatic                         flexure or splenic flexure)  K57.30, Diverticulosis of large intestine without                         perforation or abscess without bleeding CPT copyright 2022 American Medical Association. All rights reserved. The codes documented in this report are preliminary and upon coder review may  be revised to meet current compliance requirements. Dr. Ulyess Mort Lin Landsman MD, MD 11/16/2022 12:14:25 PM This report has been signed electronically. Number of Addenda: 0 Note Initiated On: 11/16/2022 11:42 AM Scope Withdrawal Time: 0 hours 10 minutes 57 seconds  Total Procedure Duration: 0 hours 21 minutes 5 seconds  Estimated Blood Loss:  Estimated blood loss: none.      [facility]

## 2022-11-16 NOTE — H&P (Signed)
Valerie Darby, MD [address]  [place]  [place], [place] [address]  Main: [phone]  Fax: [phone] Pager: [phone]  Primary Care Physician:  Cletis Athens, MD Primary Gastroenterologist:  Dr. Cephas Munoz  Pre-Procedure History & Physical: HPI:  Valerie Munoz is a 71 y.o. female is here for an colonoscopy.   Past Medical History:  Diagnosis Date   Gout    Hospitalized for tx of Left foot 2017, per pt.   Heart disease    Hypertension    Migraine headache    Neuropathy     Past Surgical History:  Procedure Laterality Date   ABDOMINAL HYSTERECTOMY     BRAIN SURGERY     COLONOSCOPY     eye tumor removal  1977    Prior to Admission medications   Medication Sig Start Date End Date Taking? Authorizing Provider  carvedilol (COREG) 25 MG tablet Take 1 tablet (25 mg total) by mouth 2 (two) times daily. 10/21/22   [name], NP  cetirizine (ZYRTEC) 10 MG tablet Take 1 tablet (10 mg total) by mouth daily. 08/31/22   Cletis Athens, MD  cholecalciferol (VITAMIN D3) 25 MCG (1000 UNIT) tablet Take 1,000 Units by mouth daily.    [provider]  folic acid (FOLVITE) 1 MG tablet Take 1 mg by mouth daily. 03/01/22   [provider]  furosemide (LASIX) 20 MG tablet Take 1 tablet (20 mg total) by mouth every other day. 07/20/22   Cletis Athens, MD  gabapentin (NEURONTIN) 400 MG capsule Take 1 capsule (400 mg total) by mouth 3 (three) times daily. 10/06/22 01/04/23  Gillis Santa, MD  losartan (COZAAR) 50 MG tablet Take 1 tablet (50 mg total) by mouth daily. 07/20/22   Cletis Athens, MD  Misc. Devices (ADJUST BATH/SHOWER SEAT) MISC 1 each by Does not apply route daily as needed. 04/30/22   [name], NP  [place]. Devices (WALL GRAB BAR) MISC 1 each by Does not apply route as directed. 05/20/22   [name], NP  multivitamin (RENA-VIT) TABS tablet Take 1 tablet by mouth daily.    [provider]  OXcarbazepine (TRILEPTAL) 150 MG tablet  Take 1 tablet (150 mg total) by mouth daily. 07/20/22   Cletis Athens, MD  traZODone (DESYREL) 50 MG tablet Take 1 tablet (50 mg total) by mouth every evening. 08/31/22   Cletis Athens, MD  triamcinolone cream (KENALOG) 0.1 % Apply 1 Application topically 2 (two) times daily. 08/31/22   Cletis Athens, MD    Allergies as of 09/03/2022   (No Known Allergies)    History reviewed. No pertinent family history.  Social History   Socioeconomic History   Marital status: Widowed    Spouse name: Not on file   Number of children: Not on file   Years of education: 14   Highest education level: Associate degree: academic program  Occupational History   Not on file  Tobacco Use   Smoking status: Never   Smokeless tobacco: Never  Vaping Use   Vaping Use: Never used  Substance and Sexual Activity   Alcohol use: Not Currently   Drug use: Never   Sexual activity: Not Currently  Other Topics Concern   Not on file  Social History Narrative   Not on file   Social Determinants of Health   Financial Resource Strain: Low Risk  (04/30/2022)   Overall Financial Resource Strain (CARDIA)    Difficulty of Paying Living Expenses: Not very hard  Food Insecurity: Food  Insecurity Present (04/30/2022)   Hunger Vital Sign    Worried About Running Out of Food in the Last Year: Sometimes true    Ran Out of Food in the Last Year: Sometimes true  Transportation Needs: No Transportation Needs (04/30/2022)   PRAPARE - Hydrologist (Medical): No    Lack of Transportation (Non-Medical): No  Physical Activity: Inactive (04/30/2022)   Exercise Vital Sign    Days of Exercise per Week: 0 days    Minutes of Exercise per Session: 0 min  Stress: No Stress Concern Present (04/30/2022)   [place]    Feeling of Stress : Not at all  Social Connections: Unknown (04/30/2022)   Social Connection and Isolation Panel [NHANES]     Frequency of Communication with Friends and Family: Patient refused    Frequency of Social Gatherings with Friends and Family: Patient refused    Attends Religious Services: Patient refused    Active Member of Clubs or Organizations: Patient refused    Attends Archivist Meetings: Patient refused    Marital Status: Widowed  Intimate Partner Violence: Not At Risk (04/30/2022)   Humiliation, Afraid, Rape, and Kick questionnaire    Fear of Current or Ex-Partner: No    Emotionally Abused: No    Physically Abused: No    Sexually Abused: No    Review of Systems: See HPI, otherwise negative ROS  Physical Exam: BP (!) 157/93   Pulse 71   Temp (!) 96.6 F (35.9 C) (Temporal)   Resp 18   Ht 5' 4.5" (1.638 m)   Wt 78.9 kg   SpO2 98%   BMI 29.41 kg/m  General:   Alert,  pleasant and cooperative in NAD Head:  Normocephalic and atraumatic. Neck:  Supple; no masses or thyromegaly. Lungs:  Clear throughout to auscultation.    Heart:  Regular rate and rhythm. Abdomen:  Soft, nontender and nondistended. Normal bowel sounds, without guarding, and without rebound.   Neurologic:  Alert and  oriented x4;  grossly normal neurologically.  Impression/Plan: Valerie Munoz is here for an colonoscopy to be performed for colon cancer screening  Risks, benefits, limitations, and alternatives regarding  colonoscopy have been reviewed with the patient.  Questions have been answered.  All parties agreeable.   Sherri Sear, MD  11/16/2022, 11:32 AM

## 2022-11-16 NOTE — Anesthesia Procedure Notes (Signed)
Procedure Name: MAC Date/Time: 11/16/2022 11:55 AM  Performed by: Jerrye Noble, CRNAPre-anesthesia Checklist: Patient identified, Emergency Drugs available, Suction available and Patient being monitored Patient Re-evaluated:Patient Re-evaluated prior to induction Oxygen Delivery Method: Nasal cannula

## 2022-11-17 ENCOUNTER — Encounter: Payer: Self-pay | Admitting: Gastroenterology

## 2022-11-18 LAB — SURGICAL PATHOLOGY

## 2022-11-18 NOTE — Anesthesia Postprocedure Evaluation (Signed)
Anesthesia Post Note  Patient: Valerie Munoz  Procedure(s) Performed: COLONOSCOPY WITH PROPOFOL  Patient location during evaluation: Endoscopy Anesthesia Type: General Level of consciousness: awake and alert Pain management: pain level controlled Vital Signs Assessment: post-procedure vital signs reviewed and stable Respiratory status: spontaneous breathing, nonlabored ventilation, respiratory function stable and patient connected to nasal cannula oxygen Cardiovascular status: blood pressure returned to baseline and stable Postop Assessment: no apparent nausea or vomiting Anesthetic complications: no   No notable events documented.   Last Vitals:  Vitals:   11/16/22 1215 11/16/22 1226  BP: 116/75 (!) 139/97  Pulse: 85 84  Resp: 16 15  Temp: (!) 35.7 C   SpO2: 100% 100%    Last Pain:  Vitals:   11/16/22 1226  TempSrc:   PainSc: 0-No pain                 Martha Clan

## 2022-11-19 ENCOUNTER — Encounter: Payer: Self-pay | Admitting: Gastroenterology

## 2022-12-15 ENCOUNTER — Ambulatory Visit
Payer: Medicare Other | Attending: Student in an Organized Health Care Education/Training Program | Admitting: Student in an Organized Health Care Education/Training Program

## 2022-12-15 ENCOUNTER — Encounter: Payer: Self-pay | Admitting: Student in an Organized Health Care Education/Training Program

## 2022-12-15 VITALS — BP 163/93 | HR 75 | Temp 97.2°F | Resp 16 | Ht 64.0 in | Wt 178.0 lb

## 2022-12-15 DIAGNOSIS — M25562 Pain in left knee: Secondary | ICD-10-CM | POA: Diagnosis not present

## 2022-12-15 DIAGNOSIS — M47816 Spondylosis without myelopathy or radiculopathy, lumbar region: Secondary | ICD-10-CM | POA: Insufficient documentation

## 2022-12-15 DIAGNOSIS — M172 Bilateral post-traumatic osteoarthritis of knee: Secondary | ICD-10-CM | POA: Insufficient documentation

## 2022-12-15 DIAGNOSIS — G894 Chronic pain syndrome: Secondary | ICD-10-CM | POA: Insufficient documentation

## 2022-12-15 DIAGNOSIS — G8929 Other chronic pain: Secondary | ICD-10-CM | POA: Diagnosis not present

## 2022-12-15 DIAGNOSIS — M5416 Radiculopathy, lumbar region: Secondary | ICD-10-CM | POA: Diagnosis not present

## 2022-12-15 DIAGNOSIS — M25561 Pain in right knee: Secondary | ICD-10-CM | POA: Diagnosis not present

## 2022-12-15 MED ORDER — ALPHA-LIPOIC ACID 600 MG PO CAPS: 600.0000 mg | ORAL_CAPSULE | Freq: Every day | ORAL | 1 refills | Status: AC

## 2022-12-15 NOTE — Progress Notes (Signed)
PROVIDER NOTE: Information contained herein reflects review and annotations entered in association with encounter. Interpretation of such information and data should be left to medically-trained personnel. Information provided to patient can be located elsewhere in the medical record under "Patient Instructions". Document created using STT-dictation technology, any transcriptional errors that may result from process are unintentional.    Patient: Valerie Munoz  Service Category: E/M  Provider: Gillis Santa, MD  DOB: [birth year]  DOS: 12/15/2022  Referring Provider: Cletis Athens, MD  MRN: [id number]  Specialty: Interventional Pain Management  PCP: Cletis Athens, MD  Type: Established Patient  Setting: Ambulatory outpatient    Location: Office  Delivery: Face-to-face     HPI  Ms. Geralyn Figiel, a 72 y.o. year old female, is here today because of her Chronic pain of both knees [M25.561, M25.562, G89.29]. Ms. Barman primary complain today is Foot Pain (bilateral) Last encounter: My last encounter with her was on 11/01/2022. Pertinent problems: Ms. Mabee has Neuropathic pain of both legs; Chronic left shoulder pain; Lumbar degenerative disc disease; Lumbar facet arthropathy; Arthritis of left glenohumeral joint; Chronic pain syndrome; and Bilateral post-traumatic osteoarthritis of knee on their pertinent problem list. Pain Assessment: Severity of Neuropathic pain is reported as a 10-Worst pain ever/10. Location: Foot Right, Left/raidates up to ankle on left foot. Onset:  . Quality: Pins and needles. Timing:  . Modifying factor(s): soaking feet. Vitals:  height is _0  (1.626 m) and weight is 178 lb (80.7 kg). Her temperature is 97.2 F (36.2 C) (abnormal). Her blood pressure is 163/93 (abnormal) and her pulse is 75. Her respiration is 16 and oxygen saturation is 100%.  BMI: Estimated body mass index is 30.55 kg/m as calculated from the following:   Height as of this encounter: _1  (1.626 m).   Weight  as of this encounter: 178 lb (80.7 kg).  Reason for encounter: post-procedure evaluation and assessment.     Post-procedure evaluation   Type: Gelsyn-3 Intra-articular Knee Injection #1  Laterality: Bilateral (-50) Level/approach: Medial Imaging guidance: None required ([id number]) Anesthesia: Local anesthesia (1-2% Lidocaine) DOS: 11/01/2022  Performed by: Gillis Santa, MD  Purpose: Diagnostic/Therapeutic Indications: Knee arthralgia associated to osteoarthritis of the knee 1. Chronic pain of both knees   2. Bilateral post-traumatic osteoarthritis of knee    NAS-11 score:   Pre-procedure: 8 /10   Post-procedure: 8 /10     Effectiveness:  Initial hour after procedure: 100 %  Subsequent 4-6 hours post-procedure: 100 %  Analgesia past initial 6 hours: 40 % (ongoing)  Ongoing improvement: Analgesic:  40-50% Function: Somewhat improved ROM: Somewhat improved    ROS  Constitutional: Denies any fever or chills Gastrointestinal: No reported hemesis, hematochezia, vomiting, or acute GI distress Musculoskeletal:  Increased low back pain, lower extremity paresthesias, lower extremity foot swelling left greater than right Neurological: No reported episodes of acute onset apraxia, aphasia, dysarthria, agnosia, amnesia, paralysis, loss of coordination, or loss of consciousness  Medication Review  Adjust Bath/Shower Seat, Alpha-Lipoic Acid, OXcarbazepine, Wall Grab Bar, carvedilol, cetirizine, cholecalciferol, folic acid, furosemide, losartan, multivitamin, traZODone, and triamcinolone cream  History Review  Allergy: Ms. Guimont has No Known Allergies. Drug: Ms. Fahmy  reports no history of drug use. Alcohol:  reports that she does not currently use alcohol. Tobacco:  reports that she has never smoked. She has never used smokeless tobacco. Social: Ms. Padia  reports that she has never smoked. She has never used smokeless tobacco. She reports that she does not currently use alcohol.  She reports  that she does not use drugs. Medical:  has a past medical history of Gout, Heart disease, Hypertension, Migraine headache, and Neuropathy. Surgical: Ms. Deines  has a past surgical history that includes eye tumor removal (1977); Abdominal hysterectomy; Colonoscopy; Brain surgery; and Colonoscopy with propofol (N/A, 11/16/2022). Family: family history is not on file.  Laboratory Chemistry Profile   Renal Lab Results  Component Value Date   BUN 23 03/24/2022   CREATININE 1.72 (H) 03/24/2022   GFRNONAA 32 (L) 03/24/2022    Hepatic No results found for: "AST", "ALT", "ALBUMIN", "ALKPHOS", "HCVAB", "AMYLASE", "LIPASE", "AMMONIA"  Electrolytes Lab Results  Component Value Date   NA 137 03/24/2022   K 4.2 03/24/2022   CL 105 03/24/2022   CALCIUM 7.8 (L) 03/24/2022   MG 2.2 06/11/2022    Bone Lab Results  Component Value Date   VD25OH 11.07 (L) 06/11/2022    Inflammation (CRP: Acute Phase) (ESR: Chronic Phase) No results found for: "CRP", "ESRSEDRATE", "LATICACIDVEN"       Note: Above Lab results reviewed.   Physical Exam  General appearance: Well nourished, well developed, and well hydrated. In no apparent acute distress Mental status: Alert, oriented x 3 (person, place, & time)       Respiratory: No evidence of acute respiratory distress Eyes: PERLA Vitals: BP (!) 163/93   Pulse 75   Temp (!) 97.2 F (36.2 C)   Resp 16   Ht _0  (1.626 m)   Wt 178 lb (80.7 kg)   SpO2 100%   BMI 30.55 kg/m  BMI: Estimated body mass index is 30.55 kg/m as calculated from the following:   Height as of this encounter: _1  (1.626 m).   Weight as of this encounter: 178 lb (80.7 kg). Ideal: Ideal body weight: 54.7 kg (120 lb 9.5 oz) Adjusted ideal body weight: 65.1 kg (143 lb 8.9 oz)  Lumbar Spine Area Exam  Skin & Axial Inspection: No masses, redness, or swelling Alignment: Symmetrical Functional ROM: Pain restricted ROM affecting both sides Stability: No instability  detected Muscle Tone/Strength: Functionally intact. No obvious neuro-muscular anomalies detected. Sensory (Neurological): Neurogenic pain pattern Palpation: No palpable anomalies       Provocative Tests: Hyperextension/rotation test: (+) bilaterally for facet joint pain. Lumbar quadrant test (Kemp's test): (+) bilaterally for facet joint pain.  *(Flexion, ABduction and External Rotation Ambulation: Unassisted Gait: Relatively normal for age and body habitus Posture: WNL  Lower Extremity Exam    Side: Right lower extremity  Side: Left lower extremity  Stability: No instability observed          Stability: No instability observed          Skin & Extremity Inspection: Edema of right foot  Skin & Extremity Inspection: Edema of left foot  Functional ROM: Unrestricted ROM                  Functional ROM: Unrestricted ROM                  Muscle Tone/Strength: Functionally intact. No obvious neuro-muscular anomalies detected.  Muscle Tone/Strength: Functionally intact. No obvious neuro-muscular anomalies detected.  Sensory (Neurological): Neurogenic pain pattern        Sensory (Neurological): Neurogenic pain pattern        DTR: Patellar: deferred today Achilles: deferred today Plantar: deferred today  DTR: Patellar: deferred today Achilles: deferred today Plantar: deferred today  Palpation: No palpable anomalies  Palpation: No palpable anomalies    Assessment   Diagnosis  Status  1. Chronic pain of both knees   2. Bilateral post-traumatic osteoarthritis of knee   3. Chronic radicular lumbar pain   4. Lumbar facet arthropathy   5. Chronic pain syndrome    Controlled Controlled Controlled   Updated Problems: Problem  Chronic Radicular Lumbar Pain    Plan of Care   Ms. Caroleena Goel has a current medication list which includes the following long-term medication(s): carvedilol, cetirizine, furosemide, losartan, adjust bath/shower seat, wall grab bar, oxcarbazepine, and  trazodone.  Ms. Stockinger is experiencing benefit from her Gelsyn injections in bilateral knees.  Her primary complaint today is low back pain with swelling of bilateral feet left greater than right as well as associated paresthesias of both feet.  We discussed a CT of her lumbar spine to rule out radicular causes of her low back and bilateral leg pain as well as associated edema of bilateral feet.  She is unable to have an MRI as she does have a Investment banker, corporate in place.  She continues to supplement with vitamin D.  We can recheck at her next visit.  I also recommend that she supplement with alpha lipoic acid for neuropathic pain of bilateral lower extremity.  Pharmacotherapy (Medications Ordered): Meds ordered this encounter  Medications   Alpha-Lipoic Acid 600 MG CAPS    Sig: Take 1 capsule (600 mg total) by mouth daily.    Dispense:  30 capsule    Refill:  1    Do not place medication on "Automatic Refill". Fill one day early if pharmacy is closed on scheduled refill date.   Orders:  Orders Placed This Encounter  Procedures   CT LUMBAR SPINE WO CONTRAST    Patient presents with axial pain with possible radicular component. Please assist [place] in identifying specific level(s) and laterality of any additional findings such as: 1. Facet (Zygapophyseal) joint DJD (Hypertrophy, space narrowing, subchondral sclerosis, and/or osteophyte formation) 2. DDD and/or IVDD (Loss of disc height, desiccation, gas patterns, osteophytes, endplate sclerosis, or "Black disc disease") 3. Pars defects 4. Spondylolisthesis, spondylosis, and/or spondyloarthropathies (include Degree/Grade of displacement in mm) (stability) 5. Vertebral body Fractures (acute/chronic) (state percentage of collapse) 6. Demineralization (osteopenia/osteoporotic) 7. Bone pathology 8. Foraminal narrowing  9. Surgical changes 10. Central, Lateral Recess, and/or Foraminal Stenosis (include AP diameter of stenosis in mm) 11.  Surgical changes (hardware type, status, and presence of fibrosis) 12. Modic Type Changes (MRI only) 13. IVDD (Disc bulge, protrusion, herniation, extrusion) (Level, laterality, extent)    Standing Status:   Future    Standing Expiration Date:   01/15/2023    Scheduling Instructions:     Imaging must be done as soon as possible. Inform patient that order will expire within 30 days and I will not renew it.    Order Specific Question:   Preferred imaging location?    Answer:   [facility]    Order Specific Question:   Call Results- Best Contact Number?    Answer:   [phone] ([facility])    Order Specific Question:   Radiology Contrast Protocol - do NOT remove file path    Answer:   [email or web address]   Follow-up plan:   Return for WILL CALL pt with CT-L spine results and discuss treatment plan then.     Bilateral intra-articular knee steroid 07/19/2022, left anterior glenohumeral joint injection 07/19/2022, left SSNB 10/06/22, bilateral Gelsyn-3 injection 11/01/2022.      Recent Visits Date Type Provider Dept  11/01/22 Procedure visit Gillis Santa, MD [facility]  10/06/22 Procedure visit Gillis Santa, MD [facility]  Showing recent visits within past 90 days and meeting all other requirements Today's Visits Date Type Provider Dept  12/15/22 Office Visit Gillis Santa, MD [facility]  Showing today's visits and meeting all other requirements Future Appointments No visits were found meeting these conditions. Showing future appointments within next 90 days and meeting all other requirements  I discussed the assessment and treatment plan with the patient. The patient was provided an opportunity to ask questions and all were answered. The patient agreed with the plan and demonstrated an understanding of the instructions.  Patient advised to call back or seek an in-person evaluation if the symptoms or condition  worsens.  Duration of encounter: 41mnutes.  Total time on encounter, as per AMA guidelines included both the face-to-face and non-face-to-face time personally spent by the physician and/or other qualified health care professional(s) on the day of the encounter (includes time in activities that require the physician or other qualified health care professional and does not include time in activities normally performed by clinical staff). Physician's time may include the following activities when performed: Preparing to see the patient (e.g., pre-charting review of records, searching for previously ordered imaging, lab work, and nerve conduction tests) Review of prior analgesic pharmacotherapies. Reviewing PMP Interpreting ordered tests (e.g., lab work, imaging, nerve conduction tests) Performing post-procedure evaluations, including interpretation of diagnostic procedures Obtaining and/or reviewing separately obtained history Performing a medically appropriate examination and/or evaluation Counseling and educating the patient/family/caregiver Ordering medications, tests, or procedures Referring and communicating with other health care professionals (when not separately reported) Documenting clinical information in the electronic or other health record Independently interpreting results (not separately reported) and communicating results to the patient/ family/caregiver Care coordination (not separately reported)  Note by: BGillis Santa MD Date: 12/15/2022; Time: 1:36 PM

## 2022-12-15 NOTE — Progress Notes (Signed)
Safety precautions to be maintained throughout the outpatient stay will include: orient to surroundings, keep bed in low position, maintain call bell within reach at all times, provide assistance with transfer out of bed and ambulation.  

## 2022-12-21 ENCOUNTER — Telehealth: Payer: Self-pay

## 2022-12-28 ENCOUNTER — Ambulatory Visit: Payer: Medicare Other | Admitting: Dermatology

## 2022-12-30 ENCOUNTER — Ambulatory Visit
Admission: RE | Admit: 2022-12-30 | Discharge: 2022-12-30 | Disposition: A | Discharge status: Home / Self Care | Payer: 59 | Source: Ambulatory Visit | Attending: Student in an Organized Health Care Education/Training Program | Admitting: Student in an Organized Health Care Education/Training Program

## 2022-12-30 DIAGNOSIS — M545 Low back pain, unspecified: Secondary | ICD-10-CM | POA: Diagnosis not present

## 2022-12-30 DIAGNOSIS — M5416 Radiculopathy, lumbar region: Secondary | ICD-10-CM | POA: Insufficient documentation

## 2022-12-30 DIAGNOSIS — G894 Chronic pain syndrome: Secondary | ICD-10-CM | POA: Insufficient documentation

## 2022-12-30 DIAGNOSIS — M47816 Spondylosis without myelopathy or radiculopathy, lumbar region: Secondary | ICD-10-CM | POA: Diagnosis not present

## 2022-12-30 DIAGNOSIS — M4316 Spondylolisthesis, lumbar region: Secondary | ICD-10-CM | POA: Diagnosis not present

## 2022-12-30 DIAGNOSIS — G8929 Other chronic pain: Secondary | ICD-10-CM

## 2023-01-04 NOTE — Progress Notes (Unsigned)
Electrophysiology Office Note:    Date:  01/05/2023   ID:  Valerie Munoz, DOB [birth year], MRN [id number]  PCP:  Valerie Athens, MD  [place] Cardiologist:  None  [facility] Electrophysiologist:  Vickie Epley, MD   Referring MD: Valerie Lo, NP   Chief Complaint: ICD in situ  History of Present Illness:    Valerie Munoz is a 72 y.o. female who presents for an evaluation of ICD in situ at the request of Valerie Lo, NP. Their medical history includes hypertension, cardiomyopathy, ICD implanted 15 years ago with generator replacement 2 years ago in [place].  The patient last saw her nurse practitioner October 21, 2022.  She presents to establish care for her ICD locally.  She is with her brother today in clinic.  No problems with her device.     Past Medical History:  Diagnosis Date   Gout    Hospitalized for tx of Left foot 2017, per pt.   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   carvedilol (COREG) 25 MG tablet Take 1 tablet (25 mg total) by mouth 2 (two) times daily.   cetirizine (ZYRTEC) 10 MG tablet Take 1 tablet (10 mg total) by mouth daily.   cholecalciferol (VITAMIN D3) 25 MCG (1000 UNIT) tablet Take 1,000 Units by mouth daily.   DULoxetine (CYMBALTA) 30 MG capsule Take 30 mg by mouth daily.   folic acid (FOLVITE) 1 MG tablet Take 1 mg by mouth daily.   furosemide (LASIX) 20 MG tablet Take 1 tablet (20 mg total) by mouth every other day.   losartan  (COZAAR) 50 MG tablet Take 1 tablet (50 mg total) by mouth daily.   Misc. Devices (ADJUST BATH/SHOWER SEAT) MISC 1 each by Does not apply route daily as needed.   Misc. Devices (WALL GRAB BAR) MISC 1 each by Does not apply route as directed.   multivitamin (RENA-VIT) TABS tablet Take 1 tablet by mouth daily.   OXcarbazepine (TRILEPTAL) 150 MG tablet Take 1 tablet (150 mg total) by mouth daily.   traZODone (DESYREL) 50 MG tablet Take 1 tablet (50 mg total) by mouth every evening.   triamcinolone cream (KENALOG) 0.1 % Apply 1 Application topically 2 (two) times daily.     Allergies:   Patient has no known allergies.   Social History   Socioeconomic History   Marital status: Widowed    Spouse name: Not on file   Number of children: Not on file   Years of education: 14   Highest education level: Associate degree: academic program  Occupational History   Not on file  Tobacco Use   Smoking status: Never   Smokeless tobacco: Never  Vaping Use   Vaping Use: Never used  Substance and Sexual Activity   Alcohol use: Not Currently   Drug use: Never   Sexual activity: Not Currently  Other Topics Concern   Not on file  Social  History Narrative   Not on file   Social Determinants of Health   Financial Resource Strain: Low Risk  (04/30/2022)   Overall Financial Resource Strain (CARDIA)    Difficulty of Paying Living Expenses: Not very hard  Food Insecurity: Food Insecurity Present (04/30/2022)   Hunger Vital Sign    Worried About Running Out of Food in the Last Year: Sometimes true    Ran Out of Food in the Last Year: Sometimes true  Transportation Needs: No Transportation Needs (04/30/2022)   PRAPARE - Hydrologist (Medical): No    Lack of Transportation (Non-Medical): No  Physical Activity: Inactive (04/30/2022)   Exercise Vital Sign    Days of Exercise per Week: 0 days    Minutes of Exercise per Session: 0 min  Stress: No Stress Concern Present  (04/30/2022)   [place]    Feeling of Stress : Not at all  Social Connections: Unknown (04/30/2022)   Social Connection and Isolation Panel [NHANES]    Frequency of Communication with Friends and Family: Patient refused    Frequency of Social Gatherings with Friends and Family: Patient refused    Attends Religious Services: Patient refused    Active Member of Clubs or Organizations: Patient refused    Attends Archivist Meetings: Patient refused    Marital Status: Widowed     Family History: The patient's family history is not on file.  ROS:   Please see the history of present illness.    All other systems reviewed and are negative.  EKGs/Labs/Other Studies Reviewed:    The following studies were reviewed today:   January 05, 2023 in clinic device interrogation personally reviewed Battery longevity okay.  Lead parameter stable.  99% biventricular pacing 22 episodes of a HVR since Apr 12, 2022. 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  Last episode of AVNRT was July 31, 2022.  Longest episode lasted over 2 hours.  EKG:  The ekg ordered today demonstrates atrial sensing, biventricular pacing.  QRS duration 126 ms.   Recent Labs: 03/24/2022: BUN 23; Creatinine, Ser 1.72; Hemoglobin 11.3; Platelets 203; Potassium 4.2; Sodium 137 06/11/2022: Magnesium 2.2  Recent Lipid Panel No results found for: "CHOL", "TRIG", "HDL", "CHOLHDL", "VLDL", "LDLCALC", "LDLDIRECT"  Physical Exam:    VS:  BP (!) 150/96   Pulse 75   Ht '5\' 4"'$  (1.626 m)   Wt 170 lb (77.1 kg)   SpO2 97%   BMI 29.18 kg/m     Wt Readings from Last 3 Encounters:  01/05/23 170 lb (77.1 kg)  12/15/22 178 lb (80.7 kg)  11/16/22 174 lb (78.9 kg)     GEN:  Well nourished, well developed in no acute distress CARDIAC: RRR, no murmurs, rubs, gallops.  ICD pocket well-healed. PSYCHIATRIC:  Normal affect       ASSESSMENT:    1. Implantable  cardioverter-defibrillator (ICD) in situ   2. Chronic systolic heart failure ([place])   3. Primary hypertension    PLAN:    In order of problems listed above:  #Chronic systolic heart failure #ICD in situ Establish in our device clinic for follow-up.  I will get an echocardiogram to establish a baseline in our system. I am also getting get a 2 view chest x-ray to document lead position given she is new to our clinic.  #Hypertension Above goal today due to patient not taking her medications yet. I have encouraged her to go home and take  her BP medications.  Recommend checking blood pressures 1-2 times per week at home and recording the values.  Recommend bringing these recordings to the primary care physician.  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  She has not had an episode in over 5 months.  Continue beta-blocker.   Follow-up in 1 year with APP.  Medication Adjustments/Labs and Tests Ordered: Current medicines are reviewed at length with the patient today.  Concerns regarding medicines are outlined above.  No orders of the defined types were placed in this encounter.  No orders of the defined types were placed in this encounter.    Signed, Hilton Cork. Quentin Ore, MD, [facility], [facility] 01/05/2023 9:15 AM    Electrophysiology [facility]

## 2023-01-04 NOTE — Telephone Encounter (Signed)
No message, error

## 2023-01-05 ENCOUNTER — Ambulatory Visit: Payer: 59 | Attending: Cardiology | Admitting: Cardiology

## 2023-01-05 ENCOUNTER — Encounter: Payer: Self-pay | Admitting: Cardiology

## 2023-01-05 VITALS — BP 150/96 | HR 75 | Ht 64.0 in | Wt 170.0 lb

## 2023-01-05 DIAGNOSIS — Z95810 Presence of automatic (implantable) cardiac defibrillator: Secondary | ICD-10-CM

## 2023-01-05 DIAGNOSIS — I5022 Chronic systolic (congestive) heart failure: Secondary | ICD-10-CM

## 2023-01-05 DIAGNOSIS — I10 Essential (primary) hypertension: Secondary | ICD-10-CM

## 2023-01-05 NOTE — Patient Instructions (Signed)
Medication Instructions:  Your physician recommends that you continue on your current medications as directed. Please refer to the Current Medication list given to you today.  *If you need a refill on your cardiac medications before your next appointment, please call your pharmacy*  Testing/Procedures: A chest x-ray takes a picture of the organs and structures inside the chest, including the heart, lungs, and blood vessels. This test can show several things, including, whether the heart is enlarges; whether fluid is building up in the lungs; and whether pacemaker / defibrillator leads are still in place.  Your physician has requested that you have an echocardiogram. Echocardiography is a painless test that uses sound waves to create images of your heart. It provides your doctor with information about the size and shape of your heart and how well your heart's chambers and valves are working. This procedure takes approximately one hour. There are no restrictions for this procedure. Please do NOT wear cologne, perfume, aftershave, or lotions (deodorant is allowed). Please arrive 15 minutes prior to your appointment time.  Follow-Up: At [facility], you and your health needs are our priority.  As part of our continuing mission to provide you with exceptional heart care, we have created designated Provider Care Teams.  These Care Teams include your primary Cardiologist (physician) and Advanced Practice Providers (APPs -  Physician Assistants and Nurse Practitioners) who all work together to provide you with the care you need, when you need it.  Your next appointment:   1 year(s)  Provider:   Mamie Levers, NP

## 2023-01-09 ENCOUNTER — Other Ambulatory Visit: Payer: Self-pay | Admitting: Internal Medicine

## 2023-01-13 ENCOUNTER — Encounter: Payer: Self-pay | Admitting: Student in an Organized Health Care Education/Training Program

## 2023-01-13 ENCOUNTER — Ambulatory Visit
Payer: 59 | Attending: Student in an Organized Health Care Education/Training Program | Admitting: Student in an Organized Health Care Education/Training Program

## 2023-01-13 VITALS — BP 161/95 | HR 83 | Temp 97.2°F | Resp 16 | Ht 64.0 in | Wt 170.0 lb

## 2023-01-13 DIAGNOSIS — G894 Chronic pain syndrome: Secondary | ICD-10-CM | POA: Diagnosis not present

## 2023-01-13 DIAGNOSIS — M5136 Other intervertebral disc degeneration, lumbar region: Secondary | ICD-10-CM

## 2023-01-13 DIAGNOSIS — G5793 Unspecified mononeuropathy of bilateral lower limbs: Secondary | ICD-10-CM | POA: Diagnosis not present

## 2023-01-13 DIAGNOSIS — M47816 Spondylosis without myelopathy or radiculopathy, lumbar region: Secondary | ICD-10-CM

## 2023-01-13 MED ORDER — DULOXETINE HCL 20 MG PO CPEP: 40.0000 mg | ORAL_CAPSULE | Freq: Every day | ORAL | 2 refills | Status: AC

## 2023-01-13 NOTE — Patient Instructions (Signed)
____________________________________________________________________________________________  General Risks and Possible Complications  Patient Responsibilities: It is important that you read this as it is part of your informed consent. It is our duty to inform you of the risks and possible complications associated with treatments offered to you. It is your responsibility as a patient to read this and to ask questions about anything that is not clear or that you believe was not covered in this document.  Patient's Rights: You have the right to refuse treatment. You also have the right to change your mind, even after initially having agreed to have the treatment done. However, under this last option, if you wait until the last second to change your mind, you may be charged for the materials used up to that point.  Introduction: Medicine is not an Chief Strategy Officer. Everything in Medicine, including the lack of treatment(s), carries the potential for danger, harm, or loss (which is by definition: Risk). In Medicine, a complication is a secondary problem, condition, or disease that can aggravate an already existing one. All treatments carry the risk of possible complications. The fact that a side effects or complications occurs, does not imply that the treatment was conducted incorrectly. It must be clearly understood that these can happen even when everything is done following the highest safety standards.  No treatment: You can choose not to proceed with the proposed treatment alternative. The "PRO(s)" would include: avoiding the risk of complications associated with the therapy. The "CON(s)" would include: not getting any of the treatment benefits. These benefits fall under one of three categories: diagnostic; therapeutic; and/or palliative. Diagnostic benefits include: getting information which can ultimately lead to improvement of the disease or symptom(s). Therapeutic benefits are those associated with  the successful treatment of the disease. Finally, palliative benefits are those related to the decrease of the primary symptoms, without necessarily curing the condition (example: decreasing the pain from a flare-up of a chronic condition, such as incurable terminal cancer).  General Risks and Complications: These are associated to most interventional treatments. They can occur alone, or in combination. They fall under one of the following six (6) categories: no benefit or worsening of symptoms; bleeding; infection; nerve damage; allergic reactions; and/or death. No benefits or worsening of symptoms: In Medicine there are no guarantees, only probabilities. No healthcare provider can ever guarantee that a medical treatment will work, they can only state the probability that it may. Furthermore, there is always the possibility that the condition may worsen, either directly, or indirectly, as a consequence of the treatment. Bleeding: This is more common if the patient is taking a blood thinner, either prescription or over the counter (example: Goody Powders, Fish oil, Aspirin, Garlic, etc.), or if suffering a condition associated with impaired coagulation (example: Hemophilia, cirrhosis of the liver, low platelet counts, etc.). However, even if you do not have one on these, it can still happen. If you have any of these conditions, or take one of these drugs, make sure to notify your treating physician. Infection: This is more common in patients with a compromised immune system, either due to disease (example: diabetes, cancer, human immunodeficiency virus [HIV], etc.), or due to medications or treatments (example: therapies used to treat cancer and rheumatological diseases). However, even if you do not have one on these, it can still happen. If you have any of these conditions, or take one of these drugs, make sure to notify your treating physician. Nerve Damage: This is more common when the treatment is an  invasive one, but it can also happen with the use of medications, such as those used in the treatment of cancer. The damage can occur to small secondary nerves, or to large primary ones, such as those in the spinal cord and brain. This damage may be temporary or permanent and it may lead to impairments that can range from temporary numbness to permanent paralysis and/or brain death. Allergic Reactions: Any time a substance or material comes in contact with our body, there is the possibility of an allergic reaction. These can range from a mild skin rash (contact dermatitis) to a severe systemic reaction (anaphylactic reaction), which can result in death. Death: In general, any medical intervention can result in death, most of the time due to an unforeseen complication. ____________________________________________________________________________________________    ______________________________________________________________________  Preparing for your procedure  During your procedure appointment there will be: No Prescription Refills. No disability issues to discussed. No medication changes or discussions.  Instructions: Food intake: Avoid eating anything solid for at least 8 hours prior to your procedure. Clear liquid intake: You may take clear liquids such as water up to 2 hours prior to your procedure. (No carbonated drinks. No soda.) Transportation: Unless otherwise stated by your physician, bring a driver. Morning Medicines: Except for blood thinners, take all of your other morning medications with a sip of water. Make sure to take your heart and blood pressure medicines. If your blood pressure's lower number is above 100, the case will be rescheduled. Blood thinners: Make sure to stop your blood thinners as instructed.  If you take a blood thinner, but were not instructed to stop it, call our office [phone] and ask to talk to a nurse. Not stopping a blood thinner prior to certain  procedures could lead to serious complications. Diabetics on insulin: Notify the staff so that you can be scheduled 1st case in the morning. If your diabetes requires high dose insulin, take only  of your normal insulin dose the morning of the procedure and notify the staff that you have done so. Preventing infections: Shower with an antibacterial soap the morning of your procedure.  Build-up your immune system: Take 1000 mg of Vitamin C with every meal (3 times a day) the day prior to your procedure. Antibiotics: Inform the nursing staff if you are taking any antibiotics or if you have any conditions that may require antibiotics prior to procedures. (Example: recent joint implants)   Pregnancy: If you are pregnant make sure to notify the nursing staff. Not doing so may result in injury to the fetus, including death.  Sickness: If you have a cold, fever, or any active infections, call and cancel or reschedule your procedure. Receiving steroids while having an infection may result in complications. Arrival: You must be in the facility at least 30 minutes prior to your scheduled procedure. Tardiness: Your scheduled time is also the cutoff time. If you do not arrive at least 15 minutes prior to your procedure, you will be rescheduled.  Children: Do not bring any children with you. Make arrangements to keep them home. Dress appropriately: There is always a possibility that your clothing may get soiled. Avoid long dresses. Valuables: Do not bring any jewelry or valuables.  Reasons to call and reschedule or cancel your procedure: (Following these recommendations will minimize the risk of a serious complication.) Surgeries: Avoid having procedures within 2 weeks of any surgery. (Avoid for 2 weeks before or after any surgery). Flu Shots: Avoid having procedures within 2  weeks of a flu shots or . (Avoid for 2 weeks before or after immunizations). Barium: Avoid having a procedure within 7-10 days after having  had a radiological study involving the use of radiological contrast. (Myelograms, Barium swallow or enema study). Heart attacks: Avoid any elective procedures or surgeries for the initial 6 months after a "Myocardial Infarction" (Heart Attack). Blood thinners: It is imperative that you stop these medications before procedures. Let us know if you if you take any blood thinner.  Infection: Avoid procedures during or within two weeks of an infection (including chest colds or gastrointestinal problems). Symptoms associated with infections include: Localized redness, fever, chills, night sweats or profuse sweating, burning sensation when voiding, cough, congestion, stuffiness, runny nose, sore throat, diarrhea, nausea, vomiting, cold or Flu symptoms, recent or current infections. It is specially important if the infection is over the area that we intend to treat. Heart and lung problems: Symptoms that may suggest an active cardiopulmonary problem include: cough, chest pain, breathing difficulties or shortness of breath, dizziness, ankle swelling, uncontrolled high or unusually low blood pressure, and/or palpitations. If you are experiencing any of these symptoms, cancel your procedure and contact your primary care physician for an evaluation.  Remember:  Regular Business hours are:  Monday to Thursday 8:00 AM to 4:00 PM  Provider's Schedule: Milinda Pointer, MD:  Procedure days: Tuesday and Thursday 7:30 AM to 4:00 PM  Gillis Santa, MD:  Procedure days: Monday and Wednesday 7:30 AM to 4:00 PM  ______________________________________________________________________   Facet Blocks Patient Information  Description: The facets are joints in the spine between the vertebrae.  Like any joints in the body, facets can become irritated and painful.  Arthritis can also effect the facets.  By injecting steroids and local anesthetic in and around these joints, we can temporarily block the nerve supply to them.   Steroids act directly on irritated nerves and tissues to reduce selling and inflammation which often leads to decreased pain.  Facet blocks may be done anywhere along the spine from the neck to the low back depending upon the location of your pain.   After numbing the skin with local anesthetic (like Novocaine), a small needle is passed onto the facet joints under x-ray guidance.  You may experience a sensation of pressure while this is being done.  The entire block usually lasts about 15-25 minutes.   Conditions which may be treated by facet blocks:  Low back/buttock pain Neck/shoulder pain Certain types of headaches  Preparation for the injection:  Do not eat any solid food or dairy products within 8 hours of your appointment. You may drink clear liquid up to 3 hours before appointment.  Clear liquids include water, black coffee, juice or soda.  No milk or cream please. You may take your regular medication, including pain medications, with a sip of water before your appointment.  Diabetics should hold regular insulin (if taken separately) and take 1/2 normal NPH dose the morning of the procedure.  Carry some sugar containing items with you to your appointment. A driver must accompany you and be prepared to drive you home after your procedure. Bring all your current medications with you. An IV may be inserted and sedation may be given at the discretion of the physician. A blood pressure cuff, EKG and other monitors will often be applied during the procedure.  Some patients may need to have extra oxygen administered for a short period. You will be asked to provide medical information, including your  allergies and medications, prior to the procedure.  We must know immediately if you are taking blood thinners (like Coumadin/Warfarin) or if you are allergic to IV iodine contrast (dye).  We must know if you could possible be pregnant.  Possible side-effects:  Bleeding from needle site Infection  (rare, may require surgery) Nerve injury (rare) Numbness & tingling (temporary) Difficulty urinating (rare, temporary) Spinal headache (a headache worse with upright posture) Light-headedness (temporary) Pain at injection site (serveral days) Decreased blood pressure (rare, temporary) Weakness in arm/leg (temporary) Pressure sensation in back/neck (temporary)   Call if you experience:  Fever/chills associated with headache or increased back/neck pain Headache worsened by an upright position New onset, weakness or numbness of an extremity below the injection site Hives or difficulty breathing (go to the emergency room) Inflammation or drainage at the injection site(s) Severe back/neck pain greater than usual New symptoms which are concerning to you  Please note:  Although the local anesthetic injected can often make your back or neck feel good for several hours after the injection, the pain will likely return. It takes 3-7 days for steroids to work.  You may not notice any pain relief for at least one week.  If effective, we will often do a series of 2-3 injections spaced 3-6 weeks apart to maximally decrease your pain.  After the initial series, you may be a candidate for a more permanent nerve block of the facets.  If you have any questions, please call #[phone]) [facility]

## 2023-01-13 NOTE — Progress Notes (Signed)
PROVIDER NOTE: Information contained herein reflects review and annotations entered in association with encounter. Interpretation of such information and data should be left to medically-trained personnel. Information provided to patient can be located elsewhere in the medical record under "Patient Instructions". Document created using STT-dictation technology, any transcriptional errors that may result from process are unintentional.    Patient: Valerie Munoz  Service Category: E/M  Provider: Gillis Santa, MD  DOB: 11/27/1951  DOS: 01/13/2023  Referring Provider: Cletis Athens, MD  MRN: [id number]  Specialty: Interventional Pain Management  PCP: Cletis Athens, MD  Type: Established Patient  Setting: Ambulatory outpatient    Location: Office  Delivery: Face-to-face     HPI  Ms. Valerie Munoz, a 72 y.o. year old female, is here today because of her Lumbar facet arthropathy [M47.816]. Ms. Valerie Munoz primary complain today is Back Pain (Lumbar left is worse ) and Foot Pain (Bilateral PN  reports that the toes are turning dark ) Last encounter: My last encounter with her was on 12/15/22 Pertinent problems: Ms. Valerie Munoz has Neuropathic pain of both legs; Chronic left shoulder pain; Lumbar degenerative disc disease; Lumbar facet arthropathy; Arthritis of left glenohumeral joint; Chronic pain syndrome; and Bilateral post-traumatic osteoarthritis of knee on their pertinent problem list. Pain Assessment: Severity of Chronic pain is reported as a 10-Worst pain ever/10. Location: Back (feet) Lower, Left/foot pain radiates up the leg.. Onset: More than a month ago. Quality: Discomfort, Constant, Other (Comment) (color changes in the toes). Timing: Constant. Modifying factor(s): nothing currently. Vitals:  height is '5\' 4"'$  (1.626 m) and weight is 170 lb (77.1 kg). Her temporal temperature is 97.2 F (36.2 C) (abnormal). Her blood pressure is 161/95 (abnormal) and her pulse is 83. Her respiration is 16 and oxygen saturation  is 100%.  BMI: Estimated body mass index is 29.18 kg/m as calculated from the following:   Height as of this encounter: '5\' 4"'$  (1.626 m).   Weight as of this encounter: 170 lb (77.1 kg).  Reason for encounter: follow-up evaluation to review CT scan of lumbar spine and bilateral foot pain as well..   Ms. Valerie Munoz is experiencing persistent and severe axial low back pain.  Today we reviewed her CT scan of her lumbar spine which is below.  She has lumbar degenerative disc disease and facet arthropathy at multiple levels in her lumbar spine. She has done physical therapy for this and tries to perform strengthening and stretching exercises for her low back at home to keep herself mobile functional. She also complains of bilateral foot paresthesias.  She was started on alpha lipoic acid at 600 mg at her last clinic visit which she states is not all that helpful.  She is currently on Cymbalta at 30 mg daily.  We discussed increasing that to 40 mg for neuropathic pain management.  I also recommend checking an A1c to see if she is a diabetic and could potentially be a candidate for Qutenza treatment.   We also discussed diagnostic lumbar facet medial branch nerve blocks for arthritic low back pain related to lumbar degenerative disc disease and lumbar facet arthropathy.    ROS  Constitutional: Denies any fever or chills Gastrointestinal: No reported hemesis, hematochezia, vomiting, or acute GI distress Musculoskeletal:  Increased low back pain, lower extremity paresthesias, lower extremity foot swelling left greater than right Neurological: No reported episodes of acute onset apraxia, aphasia, dysarthria, agnosia, amnesia, paralysis, loss of coordination, or loss of consciousness  Medication Review  Adjust Bath/Shower Seat, Alpha-Lipoic Acid, DULoxetine,  OXcarbazepine, Wall Grab Bar, carvedilol, cetirizine, cholecalciferol, folic acid, furosemide, losartan, traZODone, and triamcinolone cream   CLINICAL  DATA:  Low back pain, symptoms persist with > 6 wks treatment Lumbar radiculopathy, symptoms persist with > 6 wks treatment   EXAM: CT LUMBAR SPINE WITHOUT CONTRAST   TECHNIQUE: Multidetector CT imaging of the lumbar spine was performed without intravenous contrast administration. Multiplanar CT image reconstructions were also generated.   RADIATION DOSE REDUCTION: This exam was performed according to the departmental dose-optimization program which includes automated exposure control, adjustment of the mA and/or kV according to patient size and/or use of iterative reconstruction technique.   COMPARISON:  None Available.   FINDINGS: Segmentation: 5 non rib-bearing lumbar type vertebral bodies.   Alignment: Broad dextrocurvature centered at L2-L3. Mild grade 1 anterolisthesis of L4 on L5.   Vertebrae: No acute fracture. Vertebral body heights are maintained.   Paraspinal and other soft tissues: No acute findings. Partially imaged left renal calculus.   Disc levels: Lumbar dextrocurvature contributes to multilevel degenerative change which is greatest on the right at L1-L2, L2-L3 and L3-L4 and the left at L5-S1. Associated foraminal narrowing at these levels. Probably mild to moderate canal stenosis at L4-L5.   IMPRESSION: 1. Lumbar dextrocurvature contributes to multilevel degenerative change which is greatest on the right at L1-L2, L2-L3 and L3-L4 and the left at L5-S1. Associated foraminal narrowing at these levels. Probably mild to moderate canal stenosis at L4-L5. if the patient is able, an MRI could better assess. 2. No evidence of acute fracture or traumatic malalignment.     Electronically Signed   By: Margaretha Sheffield M.D.   On: 12/30/2022 13:57    History Review  Allergy: Ms. Valerie Munoz has No Known Allergies. Drug: Ms. Valerie Munoz  reports no history of drug use. Alcohol:  reports that she does not currently use alcohol. Tobacco:  reports that she has never  smoked. She has never used smokeless tobacco. Social: Ms. Valerie Munoz  reports that she has never smoked. She has never used smokeless tobacco. She reports that she does not currently use alcohol. She reports that she does not use drugs. Medical:  has a past medical history of Gout, Heart disease, Hypertension, Migraine headache, and Neuropathy. Surgical: Ms. Valerie Munoz  has a past surgical history that includes eye tumor removal (1977); Abdominal hysterectomy; Colonoscopy; Brain surgery; and Colonoscopy with propofol (N/A, 11/16/2022). Family: family history is not on file.  Laboratory Chemistry Profile   Renal Lab Results  Component Value Date   BUN 23 03/24/2022   CREATININE 1.72 (H) 03/24/2022   GFRNONAA 32 (L) 03/24/2022    Hepatic No results found for: "AST", "ALT", "ALBUMIN", "ALKPHOS", "HCVAB", "AMYLASE", "LIPASE", "AMMONIA"  Electrolytes Lab Results  Component Value Date   NA 137 03/24/2022   K 4.2 03/24/2022   CL 105 03/24/2022   CALCIUM 7.8 (L) 03/24/2022   MG 2.2 06/11/2022    Bone Lab Results  Component Value Date   VD25OH 11.07 (L) 06/11/2022    Inflammation (CRP: Acute Phase) (ESR: Chronic Phase) No results found for: "CRP", "ESRSEDRATE", "LATICACIDVEN"       Note: Above Lab results reviewed.   Physical Exam  General appearance: Well nourished, well developed, and well hydrated. In no apparent acute distress Mental status: Alert, oriented x 3 (person, place, & time)       Respiratory: No evidence of acute respiratory distress Eyes: PERLA Vitals: BP (!) 161/95   Pulse 83   Temp (!) 97.2 F (36.2 C) (Temporal)  Resp 16   Ht '5\' 4"'$  (1.626 m)   Wt 170 lb (77.1 kg)   SpO2 100%   BMI 29.18 kg/m  BMI: Estimated body mass index is 29.18 kg/m as calculated from the following:   Height as of this encounter: '5\' 4"'$  (1.626 m).   Weight as of this encounter: 170 lb (77.1 kg). Ideal: Ideal body weight: 54.7 kg (120 lb 9.5 oz) Adjusted ideal body weight: 63.7 kg (140 lb  5.7 oz)  Lumbar Spine Area Exam  Skin & Axial Inspection: No masses, redness, or swelling Alignment: Symmetrical Functional ROM: Pain restricted ROM affecting both sides Stability: No instability detected Muscle Tone/Strength: Functionally intact. No obvious neuro-muscular anomalies detected. Sensory (Neurological): Neurogenic pain pattern Palpation: No palpable anomalies       Provocative Tests: Hyperextension/rotation test: (+) bilaterally for facet joint pain. Lumbar quadrant test (Kemp's test): (+) bilaterally for facet joint pain.  *(Flexion, ABduction and External Rotation Ambulation: Unassisted Gait: Relatively normal for age and body habitus Posture: WNL  Lower Extremity Exam    Side: Right lower extremity  Side: Left lower extremity  Stability: No instability observed          Stability: No instability observed          Skin & Extremity Inspection: Edema of right foot  Skin & Extremity Inspection: Edema of left foot  Functional ROM: Unrestricted ROM                  Functional ROM: Unrestricted ROM                  Muscle Tone/Strength: Functionally intact. No obvious neuro-muscular anomalies detected.  Muscle Tone/Strength: Functionally intact. No obvious neuro-muscular anomalies detected.  Sensory (Neurological): Neurogenic pain pattern        Sensory (Neurological): Neurogenic pain pattern        DTR: Patellar: deferred today Achilles: deferred today Plantar: deferred today  DTR: Patellar: deferred today Achilles: deferred today Plantar: deferred today  Palpation: No palpable anomalies  Palpation: No palpable anomalies    Assessment   Diagnosis Status  1. Lumbar facet arthropathy   2. Lumbar degenerative disc disease   3. Neuropathic pain of both legs   4. Chronic pain syndrome    Persistent Persistent Persistent   Updated Problems: No problems updated.   Plan of Valerie Munoz has a history of greater than 3 months of moderate to severe pain  which is resulted in functional impairment.  The patient has tried various conservative therapeutic options such as NSAIDs, Tylenol, muscle relaxants, physical therapy which was inadequately effective.  Patient's pain is predominantly axial with physical exam and CT-Lumbar spine findings suggestive of facet arthropathy. Lumbar facet medial branch nerve blocks were discussed with the patient.  Risks and benefits were reviewed.  Patient would like to proceed with bilateral L2, L3, L4, L5 medial branch nerve block.   1. Lumbar facet arthropathy - LUMBAR FACET(MEDIAL BRANCH NERVE BLOCK) MBNB; Future  2. Lumbar degenerative disc disease - LUMBAR FACET(MEDIAL BRANCH NERVE BLOCK) MBNB; Future  3. Neuropathic pain of both legs - Hemoglobin A1c - DULoxetine (CYMBALTA) 20 MG capsule; Take 2 capsules (40 mg total) by mouth daily.  Dispense: 60 capsule; Refill: 2  4. Chronic pain syndrome - LUMBAR FACET(MEDIAL BRANCH NERVE BLOCK) MBNB; Future - DULoxetine (CYMBALTA) 20 MG capsule; Take 2 capsules (40 mg total) by mouth daily.  Dispense: 60 capsule; Refill: 2   Pharmacotherapy (Medications Ordered): Meds ordered this  encounter  Medications   DULoxetine (CYMBALTA) 20 MG capsule    Sig: Take 2 capsules (40 mg total) by mouth daily.    Dispense:  60 capsule    Refill:  2   Orders:  Orders Placed This Encounter  Procedures   LUMBAR FACET(MEDIAL BRANCH NERVE BLOCK) MBNB    Standing Status:   Future    Standing Expiration Date:   04/13/2023    Scheduling Instructions:     Procedure: Lumbar facet block (AKA.: Lumbosacral medial branch nerve block)     Side: Bilateral     Level: L2-3, L3-4, L4-5, and L5-S1 Facets (L2, L3, L4, L5, Medial Branch)     Sedation: IV Versed     Timeframe: ASAA    Order Specific Question:   Where will this procedure be performed?    Answer:   [facility]   Hemoglobin A1c    Cc PCP: Cletis Athens, MD    Order Specific Question:   CC Results    Answer:    [id number] [[id number]]    Order Specific Question:   Release to patient    Answer:   Immediate   Follow-up plan:   Return in about 2 weeks (around 01/27/2023) for B/L L2, 3, 4, 5 , in clinic IV Versed.     Bilateral intra-articular knee steroid 07/19/2022, left anterior glenohumeral joint injection 07/19/2022, left SSNB 10/06/22, bilateral Gelsyn-3 injection 11/01/2022.      Recent Visits Date Type Provider Dept  12/15/22 Office Visit Gillis Santa, MD [facility]  11/01/22 Procedure visit Gillis Santa, MD [facility]  Showing recent visits within past 90 days and meeting all other requirements Today's Visits Date Type Provider Dept  01/13/23 Office Visit Gillis Santa, MD [facility]  Showing today's visits and meeting all other requirements Future Appointments No visits were found meeting these conditions. Showing future appointments within next 90 days and meeting all other requirements  I discussed the assessment and treatment plan with the patient. The patient was provided an opportunity to ask questions and all were answered. The patient agreed with the plan and demonstrated an understanding of the instructions.  Patient advised to call back or seek an in-person evaluation if the symptoms or condition worsens.  Duration of encounter: 45mnutes.  Total time on encounter, as per AMA guidelines included both the face-to-face and non-face-to-face time personally spent by the physician and/or other qualified health care professional(s) on the day of the encounter (includes time in activities that require the physician or other qualified health care professional and does not include time in activities normally performed by clinical staff). Physician's time may include the following activities when performed: Preparing to see the patient (e.g., pre-charting review of records, searching for previously ordered imaging, lab work, and nerve conduction tests) Review of  prior analgesic pharmacotherapies. Reviewing PMP Interpreting ordered tests (e.g., lab work, imaging, nerve conduction tests) Performing post-procedure evaluations, including interpretation of diagnostic procedures Obtaining and/or reviewing separately obtained history Performing a medically appropriate examination and/or evaluation Counseling and educating the patient/family/caregiver Ordering medications, tests, or procedures Referring and communicating with other health care professionals (when not separately reported) Documenting clinical information in the electronic or other health record Independently interpreting results (not separately reported) and communicating results to the patient/ family/caregiver Care coordination (not separately reported)  Note by: BGillis Santa MD Date: 01/13/2023; Time: 10:04 AM

## 2023-01-13 NOTE — Progress Notes (Signed)
Safety precautions to be maintained throughout the outpatient stay will include: orient to surroundings, keep bed in low position, maintain call bell within reach at all times, provide assistance with transfer out of bed and ambulation.  

## 2023-01-14 LAB — HEMOGLOBIN A1C
Est. average glucose Bld gHb Est-mCnc: 117 mg/dL
Hgb A1c MFr Bld: 5.7 % — ABNORMAL HIGH (ref 4.8–5.6)

## 2023-01-27 ENCOUNTER — Other Ambulatory Visit: Payer: Self-pay | Admitting: Student in an Organized Health Care Education/Training Program

## 2023-01-27 DIAGNOSIS — G894 Chronic pain syndrome: Secondary | ICD-10-CM

## 2023-01-27 DIAGNOSIS — G5793 Unspecified mononeuropathy of bilateral lower limbs: Secondary | ICD-10-CM

## 2023-02-15 ENCOUNTER — Telehealth: Payer: Self-pay | Admitting: Student in an Organized Health Care Education/Training Program

## 2023-02-15 ENCOUNTER — Telehealth: Payer: Self-pay

## 2023-02-15 NOTE — Telephone Encounter (Signed)
Do you want me to construct a letter for this?

## 2023-02-15 NOTE — Telephone Encounter (Signed)
Patient may have to go to court and doesn't feel she can do that. She wants to know if she can get a letter stating why she has pain and should not have to go to court.

## 2023-02-15 NOTE — Telephone Encounter (Signed)
Patient saw Dr. Quentin Ore in [place] on 01/05/23.  She is requesting to transfer her monitoring from [place] office to here.   Forwarding to [facility], CMA to see how we can assist with this transfer.  Patient has a [place].

## 2023-02-15 NOTE — Telephone Encounter (Signed)
I called [address] office and left a message for them to release the patient in [place].   [place] office number is [phone].

## 2023-02-16 ENCOUNTER — Encounter: Payer: Self-pay | Admitting: Student in an Organized Health Care Education/Training Program

## 2023-02-16 NOTE — Telephone Encounter (Signed)
Patient notified

## 2023-02-17 ENCOUNTER — Encounter: Payer: Self-pay | Admitting: Cardiology

## 2023-03-01 NOTE — Telephone Encounter (Signed)
I have message P[place] from [employer] to help get the patient enrolled in [employer].

## 2023-03-03 NOTE — Telephone Encounter (Signed)
LMOVM for the 2nd time for [facility] office to release the patient.

## 2023-03-14 ENCOUNTER — Ambulatory Visit
Admission: RE | Admit: 2023-03-14 | Discharge: 2023-03-14 | Disposition: A | Discharge status: Home / Self Care | Payer: 59 | Source: Ambulatory Visit | Attending: Student in an Organized Health Care Education/Training Program | Admitting: Student in an Organized Health Care Education/Training Program

## 2023-03-14 ENCOUNTER — Encounter: Payer: Self-pay | Admitting: Student in an Organized Health Care Education/Training Program

## 2023-03-14 ENCOUNTER — Ambulatory Visit
Payer: 59 | Attending: Student in an Organized Health Care Education/Training Program | Admitting: Student in an Organized Health Care Education/Training Program

## 2023-03-14 VITALS — BP 114/81 | HR 68 | Temp 96.8°F | Resp 8 | Ht 64.0 in | Wt 166.0 lb

## 2023-03-14 DIAGNOSIS — M5136 Other intervertebral disc degeneration, lumbar region: Secondary | ICD-10-CM | POA: Insufficient documentation

## 2023-03-14 DIAGNOSIS — M47816 Spondylosis without myelopathy or radiculopathy, lumbar region: Secondary | ICD-10-CM | POA: Diagnosis not present

## 2023-03-14 DIAGNOSIS — G894 Chronic pain syndrome: Secondary | ICD-10-CM | POA: Diagnosis not present

## 2023-03-14 MED ORDER — ROPIVACAINE HCL 2 MG/ML IJ SOLN
9.0000 mL | Freq: Once | INTRAMUSCULAR | Status: AC
  Administered 2023-03-14: 9 mL via PERINEURAL
  Filled 2023-03-14: qty 20

## 2023-03-14 MED ORDER — DEXAMETHASONE SODIUM PHOSPHATE 10 MG/ML IJ SOLN
10.0000 mg | Freq: Once | INTRAMUSCULAR | Status: AC
  Administered 2023-03-14: 10 mg
  Filled 2023-03-14: qty 1

## 2023-03-14 MED ORDER — MIDAZOLAM HCL 2 MG/2ML IJ SOLN
0.5000 mg | Freq: Once | INTRAMUSCULAR | Status: AC
  Administered 2023-03-14: 2 mg via INTRAVENOUS
  Filled 2023-03-14: qty 2

## 2023-03-14 MED ORDER — LACTATED RINGERS IV SOLN: Freq: Once | INTRAVENOUS | Status: AC

## 2023-03-14 MED ORDER — LIDOCAINE HCL 2 % IJ SOLN
20.0000 mL | Freq: Once | INTRAMUSCULAR | Status: AC
  Administered 2023-03-14: 400 mg
  Filled 2023-03-14: qty 20

## 2023-03-14 NOTE — Progress Notes (Signed)
Safety precautions to be maintained throughout the outpatient stay will include: orient to surroundings, keep bed in low position, maintain call bell within reach at all times, provide assistance with transfer out of bed and ambulation.  

## 2023-03-14 NOTE — Progress Notes (Signed)
PROVIDER NOTE: Interpretation of information contained herein should be left to medically-trained personnel. Specific patient instructions are provided elsewhere under "Patient Instructions" section of medical record. This document was created in part using STT-dictation technology, any transcriptional errors that may result from this process are unintentional.  Patient: Valerie Munoz Type: Established DOB: [birth year] MRN: [id number] PCP: Cletis Athens, MD  Service: Procedure DOS: 03/14/2023 Setting: Ambulatory Location: Ambulatory outpatient facility Delivery: Face-to-face Provider: Gillis Santa, MD Specialty: Interventional Pain Management Specialty designation: 09 Location: Outpatient facility Ref. Prov.: Cletis Athens, MD       Interventional Therapy   Procedure: Lumbar Facet, Medial Branch Block(s) #1  Laterality: Bilateral  Level: L2, L3, L4, and L5 Medial Branch Level(s). Injecting these levels blocks the L2-3, L3-4, and L4-5 lumbar facet joints. Imaging: Fluoroscopic guidance         Anesthesia: Local anesthesia (1-2% Lidocaine) Anxiolysis: IV Versed 2.0 mg (minimal) DOS: 03/14/2023 Performed by: Gillis Santa, MD  Primary Purpose: Diagnostic/Therapeutic Indications: Low back pain severe enough to impact quality of life or function. 1. Lumbar facet arthropathy   2. Lumbar degenerative disc disease   3. Chronic pain syndrome    NAS-11 Pain score:   Pre-procedure: 10-Worst pain ever/10   Post-procedure: 7 /10     Position / Prep / Materials:  Position: Prone  Prep solution: DuraPrep (Iodine Povacrylex [0.7% available iodine] and Isopropyl Alcohol, 74% w/w) Area Prepped: Posterolateral Lumbosacral Spine (Wide prep: From the lower border of the scapula down to the end of the tailbone and from flank to flank.)  Materials:  Tray: Block Needle(s):  Type: Spinal  Gauge (G): 22  Length: 3.5-in Qty: 3      Pre-op H&P Assessment:  Valerie Munoz is a 72 y.o. (year old), female  patient, seen today for interventional treatment. She  has a past surgical history that includes eye tumor removal (1977); Abdominal hysterectomy; Colonoscopy; Brain surgery; and Colonoscopy with propofol (N/A, 11/16/2022). Valerie Munoz has a current medication list which includes the following prescription(s): carvedilol, cetirizine, cholecalciferol, duloxetine, folic acid, furosemide, losartan, adjust bath/shower seat, wall grab bar, oxcarbazepine, trazodone, and triamcinolone cream. Her primarily concern today is the Back Pain (Lumbar bilateral ) and Foot Pain (Bilateral )  Initial Vital Signs:  Pulse/HCG Rate: 68ECG Heart Rate: 62 Temp: (!) 96.8 F (36 C) Resp: 16 BP: (!) 142/115 SpO2: 100 %  BMI: Estimated body mass index is 28.49 kg/m as calculated from the following:   Height as of this encounter: 5\' 4"  (1.626 m).   Weight as of this encounter: 166 lb (75.3 kg).  Risk Assessment: Allergies: Reviewed. She has No Known Allergies.  Allergy Precautions: None required Coagulopathies: Reviewed. None identified.  Blood-thinner therapy: None at this time Active Infection(s): Reviewed. None identified. Valerie Munoz is afebrile  Site Confirmation: Valerie Munoz was asked to confirm the procedure and laterality before marking the site Procedure checklist: Completed Consent: Before the procedure and under the influence of no sedative(s), amnesic(s), or anxiolytics, the patient was informed of the treatment options, risks and possible complications. To fulfill our ethical and legal obligations, as recommended by the American Medical Association's Code of Ethics, I have informed the patient of my clinical impression; the nature and purpose of the treatment or procedure; the risks, benefits, and possible complications of the intervention; the alternatives, including doing nothing; the risk(s) and benefit(s) of the alternative treatment(s) or procedure(s); and the risk(s) and benefit(s) of doing nothing. The  patient was provided information about the general risks and  possible complications associated with the procedure. These may include, but are not limited to: failure to achieve desired goals, infection, bleeding, organ or nerve damage, allergic reactions, paralysis, and death. In addition, the patient was informed of those risks and complications associated to Spine-related procedures, such as failure to decrease pain; infection (i.e.: Meningitis, epidural or intraspinal abscess); bleeding (i.e.: epidural hematoma, subarachnoid hemorrhage, or any other type of intraspinal or peri-dural bleeding); organ or nerve damage (i.e.: Any type of peripheral nerve, nerve root, or spinal cord injury) with subsequent damage to sensory, motor, and/or autonomic systems, resulting in permanent pain, numbness, and/or weakness of one or several areas of the body; allergic reactions; (i.e.: anaphylactic reaction); and/or death. Furthermore, the patient was informed of those risks and complications associated with the medications. These include, but are not limited to: allergic reactions (i.e.: anaphylactic or anaphylactoid reaction(s)); adrenal axis suppression; blood sugar elevation that in diabetics may result in ketoacidosis or comma; water retention that in patients with history of congestive heart failure may result in shortness of breath, pulmonary edema, and decompensation with resultant heart failure; weight gain; swelling or edema; medication-induced neural toxicity; particulate matter embolism and blood vessel occlusion with resultant organ, and/or nervous system infarction; and/or aseptic necrosis of one or more joints. Finally, the patient was informed that Medicine is not an exact science; therefore, there is also the possibility of unforeseen or unpredictable risks and/or possible complications that may result in a catastrophic outcome. The patient indicated having understood very clearly. We have given the patient no  guarantees and we have made no promises. Enough time was given to the patient to ask questions, all of which were answered to the patient's satisfaction. Valerie Munoz has indicated that she wanted to continue with the procedure. Attestation: I, the ordering provider, attest that I have discussed with the patient the benefits, risks, side-effects, alternatives, likelihood of achieving goals, and potential problems during recovery for the procedure that I have provided informed consent. Date  Time: 03/14/2023 12:30 PM   Pre-Procedure Preparation:  Monitoring: As per clinic protocol. Respiration, ETCO2, SpO2, BP, heart rate and rhythm monitor placed and checked for adequate function Safety Precautions: Patient was assessed for positional comfort and pressure points before starting the procedure. Time-out: I initiated and conducted the "Time-out" before starting the procedure, as per protocol. The patient was asked to participate by confirming the accuracy of the "Time Out" information. Verification of the correct person, site, and procedure were performed and confirmed by me, the nursing staff, and the patient. "Time-out" conducted as per Joint Commission's Universal Protocol (UP.01.01.01). Time: [id number] Start Time: 1338 hrs.  Description of Procedure:          Laterality: (see above) Targeted Levels: (see above)  Safety Precautions: Aspiration looking for blood return was conducted prior to all injections. At no point did we inject any substances, as a needle was being advanced. Before injecting, the patient was told to immediately notify me if she was experiencing any new onset of "ringing in the ears, or metallic taste in the mouth". No attempts were made at seeking any paresthesias. Safe injection practices and needle disposal techniques used. Medications properly checked for expiration dates. SDV (single dose vial) medications used. After the completion of the procedure, all disposable equipment used was  discarded in the proper designated medical waste containers. Local Anesthesia: Protocol guidelines were followed. The patient was positioned over the fluoroscopy table. The area was prepped in the usual manner. The time-out was completed. The  target area was identified using fluoroscopy. A 12-in long, straight, sterile hemostat was used with fluoroscopic guidance to locate the targets for each level blocked. Once located, the skin was marked with an approved surgical skin marker. Once all sites were marked, the skin (epidermis, dermis, and hypodermis), as well as deeper tissues (fat, connective tissue and muscle) were infiltrated with a small amount of a short-acting local anesthetic, loaded on a 10cc syringe with a 25G, 1.5-in  Needle. An appropriate amount of time was allowed for local anesthetics to take effect before proceeding to the next step. Local Anesthetic: Lidocaine 2.0% The unused portion of the local anesthetic was discarded in the proper designated containers. Technical description of process:  L2 Medial Branch Nerve Block (MBB): The target area for the L2 medial branch is at the junction of the postero-lateral aspect of the superior articular process and the superior, posterior, and medial edge of the transverse process of L3. Under fluoroscopic guidance, a Quincke needle was inserted until contact was made with os over the superior postero-lateral aspect of the pedicular shadow (target area). After negative aspiration for blood, 81mL of the nerve block solution was injected without difficulty or complication. The needle was removed intact. L3 Medial Branch Nerve Block (MBB): The target area for the L3 medial branch is at the junction of the postero-lateral aspect of the superior articular process and the superior, posterior, and medial edge of the transverse process of L4. Under fluoroscopic guidance, a Quincke needle was inserted until contact was made with os over the superior postero-lateral  aspect of the pedicular shadow (target area). After negative aspiration for blood, 43mL of the nerve block solution was injected without difficulty or complication. The needle was removed intact. L4 Medial Branch Nerve Block (MBB): The target area for the L4 medial branch is at the junction of the postero-lateral aspect of the superior articular process and the superior, posterior, and medial edge of the transverse process of L5. Under fluoroscopic guidance, a Quincke needle was inserted until contact was made with os over the superior postero-lateral aspect of the pedicular shadow (target area). After negative aspiration for blood, 74mL of the nerve block solution was injected without difficulty or complication. The needle was removed intact. L5 Medial Branch Nerve Block (MBB): The target area for the L5 medial branch is at the junction of the postero-lateral aspect of the superior articular process and the superior, posterior, and medial edge of the sacral ala. Under fluoroscopic guidance, a Quincke needle was inserted until contact was made with os over the superior postero-lateral aspect of the pedicular shadow (target area). After negative aspiration for blood, 60mL of the nerve block solution was injected without difficulty or complication. The needle was removed intact.   Once the entire procedure was completed, the treated area was cleaned, making sure to leave some of the prepping solution back to take advantage of its long term bactericidal properties.         Illustration of the posterior view of the lumbar spine and the posterior neural structures. Laminae of L2 through S1 are labeled. DPRL5, dorsal primary ramus of L5; DPRS1, dorsal primary ramus of S1; DPR3, dorsal primary ramus of L3; FJ, facet (zygapophyseal) joint L3-L4; I, inferior articular process of L4; LB1, lateral branch of dorsal primary ramus of L1; IAB, inferior articular branches from L3 medial branch (supplies L4-L5 facet joint);  IBP, intermediate branch plexus; MB3, medial branch of dorsal primary ramus of L3; NR3, third lumbar nerve root; S,  superior articular process of L5; SAB, superior articular branches from L4 (supplies L4-5 facet joint also); TP3, transverse process of L3.   Facet Joint Innervation (* possible contribution)  L1-2 T12, L1 (L2*)  Medial Branch  L2-3 L1, L2 (L3*)         "          "  L3-4 L2, L3 (L4*)         "          "  L4-5 L3, L4 (L5*)         "          "  L5-S1 L4, L5, S1          "          "    Vitals:   03/14/23 1343 03/14/23 1348 03/14/23 1352 03/14/23 1359  BP: (!) 142/97 (!) 141/99 (!) 130/112 114/81  Pulse:      Resp: 10 11 (!) 8   Temp:      TempSrc:      SpO2: 96% 97% 98% 100%  Weight:      Height:         End Time: 1352 hrs.  Imaging Guidance (Spinal):          Type of Imaging Technique: Fluoroscopy Guidance (Spinal) Indication(s): Assistance in needle guidance and placement for procedures requiring needle placement in or near specific anatomical locations not easily accessible without such assistance. Exposure Time: Please see nurses notes. Contrast: None used. Fluoroscopic Guidance: I was personally present during the use of fluoroscopy. "Tunnel Vision Technique" used to obtain the best possible view of the target area. Parallax error corrected before commencing the procedure. "Direction-depth-direction" technique used to introduce the needle under continuous pulsed fluoroscopy. Once target was reached, antero-posterior, oblique, and lateral fluoroscopic projection used confirm needle placement in all planes. Images permanently stored in EMR. Interpretation: No contrast injected. I personally interpreted the imaging intraoperatively. Adequate needle placement confirmed in multiple planes. Permanent images saved into the patient's record.  Post-operative Assessment:  Post-procedure Vital Signs:  Pulse/HCG Rate: 6872 Temp: (!) 96.8 F (36 C) Resp: (!) 8 BP:  114/81 SpO2: 100 %  EBL: None  Complications: No immediate post-treatment complications observed by team, or reported by patient.  Note: The patient tolerated the entire procedure well. A repeat set of vitals were taken after the procedure and the patient was kept under observation following institutional policy, for this type of procedure. Post-procedural neurological assessment was performed, showing return to baseline, prior to discharge. The patient was provided with post-procedure discharge instructions, including a section on how to identify potential problems. Should any problems arise concerning this procedure, the patient was given instructions to immediately contact us, at any time, without hesitation. In any case, we plan to contact the patient by telephone for a follow-up status report regarding this interventional procedure.  Comments:  No additional relevant information.  Plan of Care (POC)  Orders:  Orders Placed This Encounter  Procedures   DG PAIN CLINIC C-ARM 1-60 MIN NO REPORT    Intraoperative interpretation by procedural physician at [facility].    Standing Status:   Standing    Number of Occurrences:   1    Order Specific Question:   Reason for exam:    Answer:   Assistance in needle guidance and placement for procedures requiring needle placement in or near specific anatomical locations not easily accessible without such assistance.  Medications ordered for procedure: Meds ordered this encounter  Medications   lidocaine (XYLOCAINE) 2 % (with pres) injection 400 mg   lactated ringers infusion   midazolam (VERSED) injection 0.5-2 mg    Make sure Flumazenil is available in the pyxis when using this medication. If oversedation occurs, administer 0.2 mg IV over 15 sec. If after 45 sec no response, administer 0.2 mg again over 1 min; may repeat at 1 min intervals; not to exceed 4 doses (1 mg)   dexamethasone (DECADRON) injection 10 mg   dexamethasone  (DECADRON) injection 10 mg   ropivacaine (PF) 2 mg/mL (0.2%) (NAROPIN) injection 9 mL   Medications administered: We administered lidocaine, lactated ringers, midazolam, dexamethasone, dexamethasone, and ropivacaine (PF) 2 mg/mL (0.2%).  See the medical record for exact dosing, route, and time of administration.  Follow-up plan:   Return in about 5 weeks (around 04/18/2023), or PPE F2F.       Bilateral intra-articular knee steroid 07/19/2022, left anterior glenohumeral joint injection 07/19/2022, left SSNB 10/06/22, bilateral Gelsyn-3 injection 11/01/2022. B/L L2,3,4,5 MBNB        Recent Visits Date Type Provider Dept  01/13/23 Office Visit Gillis Santa, MD [facility]  12/15/22 Office Visit Gillis Santa, MD [facility]  Showing recent visits within past 90 days and meeting all other requirements Today's Visits Date Type Provider Dept  03/14/23 Procedure visit Gillis Santa, MD [facility]  Showing today's visits and meeting all other requirements Future Appointments Date Type Provider Dept  04/18/23 Appointment Gillis Santa, MD [facility]  Showing future appointments within next 90 days and meeting all other requirements  Disposition: Discharge home  Discharge (Date  Time): 03/14/2023; 1405 hrs.   Primary Care Physician: Cletis Athens, MD Location: [facility] Note by: Gillis Santa, MD (TTS technology used. I apologize for any typographical errors that were not detected and corrected.) Date: 03/14/2023; Time: 2:32 PM  Disclaimer:  Medicine is not an Chief Strategy Officer. The only guarantee in medicine is that nothing is guaranteed. It is important to note that the decision to proceed with this intervention was based on the information collected from the patient. The Data and conclusions were drawn from the patient's questionnaire, the interview, and the physical examination. Because the information was provided in large  part by the patient, it cannot be guaranteed that it has not been purposely or unconsciously manipulated. Every effort has been made to obtain as much relevant data as possible for this evaluation. It is important to note that the conclusions that lead to this procedure are derived in large part from the available data. Always take into account that the treatment will also be dependent on availability of resources and existing treatment guidelines, considered by other Pain Management Practitioners as being common knowledge and practice, at the time of the intervention. For Medico-Legal purposes, it is also important to point out that variation in procedural techniques and pharmacological choices are the acceptable norm. The indications, contraindications, technique, and results of the above procedure should only be interpreted and judged by a Board-Certified Interventional Pain Specialist with extensive familiarity and expertise in the same exact procedure and technique.

## 2023-03-14 NOTE — Patient Instructions (Signed)

## 2023-03-15 ENCOUNTER — Telehealth: Payer: Self-pay | Admitting: *Deleted

## 2023-03-15 NOTE — Telephone Encounter (Signed)
Attempted to call for post procedure follow-up. Message left. 

## 2023-03-16 NOTE — Telephone Encounter (Signed)
Pauvan from [employer] states the patient is in our [place] system. He is going to look into why the patient is not showing up in our system.

## 2023-03-23 DIAGNOSIS — M79672 Pain in left foot: Secondary | ICD-10-CM | POA: Diagnosis not present

## 2023-03-23 DIAGNOSIS — G8929 Other chronic pain: Secondary | ICD-10-CM | POA: Diagnosis not present

## 2023-03-23 DIAGNOSIS — G629 Polyneuropathy, unspecified: Secondary | ICD-10-CM | POA: Diagnosis not present

## 2023-03-23 DIAGNOSIS — R7989 Other specified abnormal findings of blood chemistry: Secondary | ICD-10-CM | POA: Diagnosis not present

## 2023-03-23 DIAGNOSIS — M545 Low back pain, unspecified: Secondary | ICD-10-CM | POA: Diagnosis not present

## 2023-03-23 DIAGNOSIS — M25512 Pain in left shoulder: Secondary | ICD-10-CM | POA: Diagnosis not present

## 2023-03-23 DIAGNOSIS — R202 Paresthesia of skin: Secondary | ICD-10-CM | POA: Diagnosis not present

## 2023-03-23 DIAGNOSIS — M79671 Pain in right foot: Secondary | ICD-10-CM | POA: Diagnosis not present

## 2023-03-30 DIAGNOSIS — L600 Ingrowing nail: Secondary | ICD-10-CM | POA: Diagnosis not present

## 2023-03-30 DIAGNOSIS — B351 Tinea unguium: Secondary | ICD-10-CM | POA: Diagnosis not present

## 2023-03-30 DIAGNOSIS — M79675 Pain in left toe(s): Secondary | ICD-10-CM | POA: Diagnosis not present

## 2023-03-30 DIAGNOSIS — M79674 Pain in right toe(s): Secondary | ICD-10-CM | POA: Diagnosis not present

## 2023-03-30 DIAGNOSIS — G629 Polyneuropathy, unspecified: Secondary | ICD-10-CM | POA: Diagnosis not present

## 2023-04-06 NOTE — Telephone Encounter (Signed)
Pt now in our clinic.

## 2023-04-12 DIAGNOSIS — M5442 Lumbago with sciatica, left side: Secondary | ICD-10-CM | POA: Diagnosis not present

## 2023-04-12 DIAGNOSIS — M5416 Radiculopathy, lumbar region: Secondary | ICD-10-CM | POA: Diagnosis not present

## 2023-04-12 DIAGNOSIS — G8929 Other chronic pain: Secondary | ICD-10-CM | POA: Diagnosis not present

## 2023-04-12 DIAGNOSIS — M25562 Pain in left knee: Secondary | ICD-10-CM | POA: Diagnosis not present

## 2023-04-12 DIAGNOSIS — M5441 Lumbago with sciatica, right side: Secondary | ICD-10-CM | POA: Diagnosis not present

## 2023-04-12 DIAGNOSIS — G6289 Other specified polyneuropathies: Secondary | ICD-10-CM | POA: Diagnosis not present

## 2023-04-18 ENCOUNTER — Ambulatory Visit: Payer: 59 | Admitting: Student in an Organized Health Care Education/Training Program

## 2023-05-30 DIAGNOSIS — M5416 Radiculopathy, lumbar region: Secondary | ICD-10-CM | POA: Diagnosis not present

## 2023-06-07 ENCOUNTER — Encounter: Payer: Self-pay | Admitting: Emergency Medicine

## 2023-06-07 ENCOUNTER — Other Ambulatory Visit: Payer: Self-pay

## 2023-06-07 DIAGNOSIS — Z5321 Procedure and treatment not carried out due to patient leaving prior to being seen by health care provider: Secondary | ICD-10-CM | POA: Diagnosis not present

## 2023-06-07 DIAGNOSIS — G609 Hereditary and idiopathic neuropathy, unspecified: Secondary | ICD-10-CM | POA: Insufficient documentation

## 2023-06-07 LAB — COMPREHENSIVE METABOLIC PANEL
ALT: 15 U/L (ref 0–44)
AST: 16 U/L (ref 15–41)
Albumin: 4.1 g/dL (ref 3.5–5.0)
Alkaline Phosphatase: 65 U/L (ref 38–126)
Anion gap: 8 (ref 5–15)
BUN: 30 mg/dL — ABNORMAL HIGH (ref 8–23)
CO2: 25 mmol/L (ref 22–32)
Calcium: 8.7 mg/dL — ABNORMAL LOW (ref 8.9–10.3)
Chloride: 107 mmol/L (ref 98–111)
Creatinine, Ser: 1.79 mg/dL — ABNORMAL HIGH (ref 0.44–1.00)
GFR, Estimated: 30 mL/min — ABNORMAL LOW (ref >60)
Glucose, Bld: 116 mg/dL — ABNORMAL HIGH (ref 70–99)
Potassium: 4.7 mmol/L (ref 3.5–5.1)
Sodium: 140 mmol/L (ref 135–145)
Total Bilirubin: 0.7 mg/dL (ref 0.3–1.2)
Total Protein: 7.7 g/dL (ref 6.5–8.1)

## 2023-06-07 LAB — CBC
HCT: 35.6 % — ABNORMAL LOW (ref 36.0–46.0)
Hemoglobin: 11.7 g/dL — ABNORMAL LOW (ref 12.0–15.0)
MCH: 31 pg (ref 26.0–34.0)
MCHC: 32.9 g/dL (ref 30.0–36.0)
MCV: 94.2 fL (ref 80.0–100.0)
Platelets: 182 10*3/uL (ref 150–400)
RBC: 3.78 MIL/uL — ABNORMAL LOW (ref 3.87–5.11)
RDW: 12.6 % (ref 11.5–15.5)
WBC: 8.3 10*3/uL (ref 4.0–10.5)
nRBC: 0 % (ref 0.0–0.2)

## 2023-06-07 NOTE — ED Triage Notes (Addendum)
Pt arrived via POV with reports loss of feeling in feet, pt reports she is having neuropathy and c/o pain and states the pain is unbearable tonight. Pt is ambulatory at this time. Pt states nothing has been done for her neuropathy since moving to [place].   Denies any hx of diabetes.  Pt sees podiatry, hx of chronic back pain has had nerve blocks in the past for the pain in back. Last was done last week.   Pt states she was on gabapentin but did not help. Pt also reports hx of gout.  Bilateral feet warm to the touch, palpable pulses. Pt ambulatory with a cane.

## 2023-06-08 ENCOUNTER — Emergency Department
Admission: EM | Admit: 2023-06-08 | Discharge: 2023-06-08 | Discharge status: Against Medical Advice | Payer: 59 | Attending: Emergency Medicine | Admitting: Emergency Medicine

## 2023-06-14 DIAGNOSIS — M5441 Lumbago with sciatica, right side: Secondary | ICD-10-CM | POA: Diagnosis not present

## 2023-06-14 DIAGNOSIS — M5442 Lumbago with sciatica, left side: Secondary | ICD-10-CM | POA: Diagnosis not present

## 2023-06-14 DIAGNOSIS — G8929 Other chronic pain: Secondary | ICD-10-CM | POA: Diagnosis not present

## 2023-06-14 DIAGNOSIS — M5416 Radiculopathy, lumbar region: Secondary | ICD-10-CM | POA: Diagnosis not present

## 2023-07-04 DIAGNOSIS — G629 Polyneuropathy, unspecified: Secondary | ICD-10-CM | POA: Diagnosis not present

## 2023-07-04 DIAGNOSIS — R2689 Other abnormalities of gait and mobility: Secondary | ICD-10-CM | POA: Diagnosis not present

## 2023-07-04 DIAGNOSIS — R202 Paresthesia of skin: Secondary | ICD-10-CM | POA: Diagnosis not present

## 2023-07-04 DIAGNOSIS — R200 Anesthesia of skin: Secondary | ICD-10-CM | POA: Diagnosis not present

## 2023-07-04 DIAGNOSIS — Z7689 Persons encountering health services in other specified circumstances: Secondary | ICD-10-CM | POA: Diagnosis not present

## 2023-07-07 ENCOUNTER — Other Ambulatory Visit: Payer: Self-pay | Admitting: Family Medicine

## 2023-07-07 DIAGNOSIS — N189 Chronic kidney disease, unspecified: Secondary | ICD-10-CM | POA: Diagnosis not present

## 2023-07-07 DIAGNOSIS — R7303 Prediabetes: Secondary | ICD-10-CM | POA: Diagnosis not present

## 2023-07-07 DIAGNOSIS — I159 Secondary hypertension, unspecified: Secondary | ICD-10-CM | POA: Diagnosis not present

## 2023-07-07 DIAGNOSIS — Z1231 Encounter for screening mammogram for malignant neoplasm of breast: Secondary | ICD-10-CM

## 2023-07-07 DIAGNOSIS — Z7689 Persons encountering health services in other specified circumstances: Secondary | ICD-10-CM | POA: Diagnosis not present

## 2023-07-07 DIAGNOSIS — I129 Hypertensive chronic kidney disease with stage 1 through stage 4 chronic kidney disease, or unspecified chronic kidney disease: Secondary | ICD-10-CM | POA: Diagnosis not present

## 2023-07-07 DIAGNOSIS — Z0000 Encounter for general adult medical examination without abnormal findings: Secondary | ICD-10-CM | POA: Diagnosis not present

## 2023-07-07 DIAGNOSIS — M5136 Other intervertebral disc degeneration, lumbar region: Secondary | ICD-10-CM | POA: Diagnosis not present

## 2023-07-07 DIAGNOSIS — G894 Chronic pain syndrome: Secondary | ICD-10-CM | POA: Diagnosis not present

## 2023-07-07 DIAGNOSIS — G629 Polyneuropathy, unspecified: Secondary | ICD-10-CM | POA: Diagnosis not present

## 2023-07-07 DIAGNOSIS — Z95810 Presence of automatic (implantable) cardiac defibrillator: Secondary | ICD-10-CM | POA: Diagnosis not present

## 2023-07-07 DIAGNOSIS — Z23 Encounter for immunization: Secondary | ICD-10-CM | POA: Diagnosis not present

## 2023-07-12 DIAGNOSIS — M8588 Other specified disorders of bone density and structure, other site: Secondary | ICD-10-CM | POA: Diagnosis not present

## 2023-07-18 DIAGNOSIS — Z7689 Persons encountering health services in other specified circumstances: Secondary | ICD-10-CM | POA: Diagnosis not present

## 2023-07-18 DIAGNOSIS — I10 Essential (primary) hypertension: Secondary | ICD-10-CM | POA: Diagnosis not present

## 2023-07-18 DIAGNOSIS — M5441 Lumbago with sciatica, right side: Secondary | ICD-10-CM | POA: Diagnosis not present

## 2023-07-18 DIAGNOSIS — M5442 Lumbago with sciatica, left side: Secondary | ICD-10-CM | POA: Diagnosis not present

## 2023-07-18 DIAGNOSIS — M5136 Other intervertebral disc degeneration, lumbar region: Secondary | ICD-10-CM | POA: Diagnosis not present

## 2023-07-18 DIAGNOSIS — G8929 Other chronic pain: Secondary | ICD-10-CM | POA: Diagnosis not present

## 2023-07-18 DIAGNOSIS — M48061 Spinal stenosis, lumbar region without neurogenic claudication: Secondary | ICD-10-CM | POA: Diagnosis not present

## 2023-07-18 DIAGNOSIS — M47816 Spondylosis without myelopathy or radiculopathy, lumbar region: Secondary | ICD-10-CM | POA: Diagnosis not present

## 2023-07-24 ENCOUNTER — Other Ambulatory Visit: Payer: Self-pay

## 2023-07-24 ENCOUNTER — Emergency Department: Payer: 59

## 2023-07-24 ENCOUNTER — Emergency Department
Admission: EM | Admit: 2023-07-24 | Discharge: 2023-07-24 | Disposition: A | Discharge status: Home / Self Care | Payer: 59 | Attending: Emergency Medicine | Admitting: Emergency Medicine

## 2023-07-24 ENCOUNTER — Encounter: Payer: Self-pay | Admitting: Emergency Medicine

## 2023-07-24 DIAGNOSIS — Z95810 Presence of automatic (implantable) cardiac defibrillator: Secondary | ICD-10-CM | POA: Diagnosis not present

## 2023-07-24 DIAGNOSIS — M791 Myalgia, unspecified site: Secondary | ICD-10-CM | POA: Diagnosis not present

## 2023-07-24 DIAGNOSIS — M7918 Myalgia, other site: Secondary | ICD-10-CM | POA: Diagnosis not present

## 2023-07-24 DIAGNOSIS — I509 Heart failure, unspecified: Secondary | ICD-10-CM | POA: Diagnosis not present

## 2023-07-24 DIAGNOSIS — R0781 Pleurodynia: Secondary | ICD-10-CM | POA: Diagnosis not present

## 2023-07-24 DIAGNOSIS — I10 Essential (primary) hypertension: Secondary | ICD-10-CM | POA: Diagnosis not present

## 2023-07-24 DIAGNOSIS — R0989 Other specified symptoms and signs involving the circulatory and respiratory systems: Secondary | ICD-10-CM | POA: Diagnosis not present

## 2023-07-24 DIAGNOSIS — I110 Hypertensive heart disease with heart failure: Secondary | ICD-10-CM | POA: Insufficient documentation

## 2023-07-24 DIAGNOSIS — I700 Atherosclerosis of aorta: Secondary | ICD-10-CM | POA: Diagnosis not present

## 2023-07-24 LAB — CBC
HCT: 30.5 % — ABNORMAL LOW (ref 36.0–46.0)
Hemoglobin: 9.9 g/dL — ABNORMAL LOW (ref 12.0–15.0)
MCH: 31.3 pg (ref 26.0–34.0)
MCHC: 32.5 g/dL (ref 30.0–36.0)
MCV: 96.5 fL (ref 80.0–100.0)
Platelets: 157 10*3/uL (ref 150–400)
RBC: 3.16 MIL/uL — ABNORMAL LOW (ref 3.87–5.11)
RDW: 13 % (ref 11.5–15.5)
WBC: 7.1 10*3/uL (ref 4.0–10.5)
nRBC: 0 % (ref 0.0–0.2)

## 2023-07-24 LAB — BASIC METABOLIC PANEL
Anion gap: 7 (ref 5–15)
BUN: 20 mg/dL (ref 8–23)
CO2: 22 mmol/L (ref 22–32)
Calcium: 7.8 mg/dL — ABNORMAL LOW (ref 8.9–10.3)
Chloride: 106 mmol/L (ref 98–111)
Creatinine, Ser: 1.63 mg/dL — ABNORMAL HIGH (ref 0.44–1.00)
GFR, Estimated: 34 mL/min — ABNORMAL LOW (ref >60)
Glucose, Bld: 89 mg/dL (ref 70–99)
Potassium: 4.3 mmol/L (ref 3.5–5.1)
Sodium: 135 mmol/L (ref 135–145)

## 2023-07-24 MED ORDER — POTASSIUM CHLORIDE CRYS ER 20 MEQ PO TBCR: 20.0000 meq | EXTENDED_RELEASE_TABLET | Freq: Once | ORAL | Status: DC

## 2023-07-24 MED ORDER — PREDNISONE 5 MG PO TABS: 5.0000 mg | ORAL_TABLET | Freq: Every day | ORAL | 0 refills | Status: DC

## 2023-07-24 NOTE — ED Triage Notes (Addendum)
Error

## 2023-07-24 NOTE — ED Provider Notes (Signed)
[facility] Provider Note    Event Date/Time   First MD Initiated Contact with Patient 07/24/23 [phone]     (approximate)   History   Back Pain, Knee Pain, and Leg Swelling   HPI  Valerie Munoz is a 72 y.o. female   presents to the ED with multiple complaints of joint pain and muscle aches with onset being 3 days ago.  Patient denies any recent injuries.  She also believes that her feet and ankles are swollen.  She denies any shortness of breath, chest pain, difficulty breathing.  No over-the-counter medications have been taken.  Patient has a history of hypertension, chronic pain syndrome, degenerative disc disease, chronic left shoulder pain, neuropathy bilateral legs, patient also has a automatic cardioverter/defibrillator.      Physical Exam   Triage Vital Signs: ED Triage Vitals  Encounter Vitals Group     BP 07/24/23 0437 121/76     Systolic BP Percentile --      Diastolic BP Percentile --      Pulse Rate 07/24/23 0437 64     Resp 07/24/23 0437 18     Temp 07/24/23 0437 98 F (36.7 C)     Temp Source 07/24/23 0437 Oral     SpO2 07/24/23 0437 94 %     Weight 07/24/23 0716 171 lb 1.2 oz (77.6 kg)     Height 07/24/23 0716 5\' 4"  (1.626 m)     Head Circumference --      Peak Flow --      Pain Score --      Pain Loc --      Pain Education --      Exclude from Growth Chart --     Most recent vital signs: Vitals:   07/24/23 0437 07/24/23 0847  BP: 121/76 120/70  Pulse: 64 70  Resp: 18 18  Temp: 98 F (36.7 C) 98 F (36.7 C)  SpO2: 94% 95%     General: Awake, no distress.  Alert, oriented, talkative. CV:  Good peripheral perfusion.  Heart regular rate and rhythm. Resp:  Normal effort.  Lungs are clear bilaterally. Abd:  No distention.  Other:  Patient is able to move upper and lower extremities without any difficulty.  She is able to stand and ambulate without any assistance.  No pitting edema noted on examination of the ankles or feet  bilaterally.  Patient is able to move joints without restriction.  No warmth or redness is noted.   ED Results / Procedures / Treatments   Labs (all labs ordered are listed, but only abnormal results are displayed) Labs Reviewed  BASIC METABOLIC PANEL - Abnormal; Notable for the following components:      Result Value   Creatinine, Ser 1.63 (*)    Calcium 7.8 (*)    GFR, Estimated 34 (*)    All other components within normal limits  CBC - Abnormal; Notable for the following components:   RBC 3.16 (*)    Hemoglobin 9.9 (*)    HCT 30.5 (*)    All other components within normal limits     EKG  Vent. rate 62 BPM PR interval 152 ms QRS duration 128 ms QT/QTcB 474/481 ms P-R-T axes 60 -45 -39 Atrial-sensed ventricular-paced rhythm Biventricular pacemaker detected Abnormal ECG When compared with ECG of 24-Mar-2022 13:26,   RADIOLOGY Chest x-ray images were reviewed by myself independent of the radiologist and was negative for pneumonia or exacerbation of her CHF.  PROCEDURES:  Critical Care performed:   Procedures   MEDICATIONS ORDERED IN ED: Medications - No data to display   IMPRESSION / MDM / ASSESSMENT AND PLAN / ED COURSE  I reviewed the triage vital signs and the nursing notes.   Differential diagnosis includes, but is not limited to, muscle skeletal pain, muscle skeletal strain, fluid retention, gout, arthritis.  72 year old female presents to the ED with multiple muscle/joint aches without history of injury.  Physical exam is unremarkable and no edema is noted to her feet or ankles.  Patient states that she has been on Lyrica and gabapentin in the past without any relief.  She has to follow-up with her PCP.  To give her some relief at this time since there are medications that she cannot take a prescription for prednisone 5 mg 1 daily with food was sent to the pharmacy for her to begin taking.  She is aware that this is a limited amount at a lower dose and  still could interfere with her appetite, sleeping or energy.      Patient's presentation is most consistent with exacerbation of chronic illness.  FINAL CLINICAL IMPRESSION(S) / ED DIAGNOSES   Final diagnoses:  Musculoskeletal pain     Rx / DC Orders   ED Discharge Orders          Ordered    predniSONE (DELTASONE) 5 MG tablet  Daily with breakfast        07/24/23 0907             Note:  This document was prepared using Dragon voice recognition software and may include unintentional dictation errors.   Tommi Rumps, PA-C 07/24/23 1501    Minna Antis, MD 07/24/23 1506

## 2023-07-24 NOTE — ED Notes (Signed)
See triage note  Presents with some lower  back pain  states pain is mainly to the right side  Non radiating  Ambulates well  States she also noticed some swelling to her feet yesterday

## 2023-07-24 NOTE — ED Triage Notes (Signed)
Patient C/O lower back, left rib, and right knee pain that began on Thursday. She is also C/O bilateral foot and ankle swelling that began on Friday. Of note, patient has history of CHF. She denies any injury, CP or SOB at this time.

## 2023-07-24 NOTE — Discharge Instructions (Signed)
Call make an appoint with your primary care provider if any continued problems.  The prednisone is 1 time a day to be taken in the morning with food.  Remember that this can increase your appetite, interfere with your sleeping or cause increased energy.  You may also take Tylenol with this medication if additional pain medication is needed.

## 2023-07-28 ENCOUNTER — Telehealth: Payer: Self-pay

## 2023-07-28 NOTE — Telephone Encounter (Signed)
Alert received "No messages received for at least 21 days Last message received 37 days ago. The patient will be deactivated in 53 days."

## 2023-07-28 NOTE — Telephone Encounter (Signed)
LMTCB

## 2023-07-29 ENCOUNTER — Telehealth: Payer: Self-pay

## 2023-07-29 NOTE — Telephone Encounter (Signed)
Transition Care Management Follow-up Telephone Call Date of discharge and from where: 07/24/2023  How have you been since you were released from the hospital? Patient stated she feels about the same. She has almost completed her prednisone. Any questions or concerns? No  Items Reviewed: Did the pt receive and understand the discharge instructions provided? Yes  Medications obtained and verified? Yes  Other?  Patient consented to [facility] referral to [employer] Whe[employer]. Any new allergies since your discharge? No  Dietary orders reviewed? Yes Do you have support at home? Yes   Follow up appointments reviewed:  PCP Hospital f/u appt confirmed?  Patient stated that she called the NP to let her know she had visited the ED.  Scheduled to see  on  @ . [facility] f/u appt confirmed? Yes  Scheduled to see Marisue Ivan, MD on 08/10/2023 @ [facility]. Are transportation arrangements needed? No  If their condition worsens, is the pt aware to call PCP or go to the Emergency Dept.? Yes Was the patient provided with contact information for the PCP's office or ED? Yes Was to pt encouraged to call back with questions or concerns? Yes  Heidie Krall [facility]  [facility] Population Health Community Resource Care Guide   ??[email or web address]  ?? [phone]   Website: [email or web address]  [email or web address]

## 2023-07-29 NOTE — Telephone Encounter (Signed)
Transition Care Management Unsuccessful Follow-up Telephone Call  Date of discharge and from where:  07/24/2023 [facility]  Attempts:  1st Attempt  Reason for unsuccessful TCM follow-up call:  No answer/busy  Odell Fasching [facility]  [facility] Population Health Community Resource Care Guide   ??[email or web address]  ?? [phone]   Website: [email or web address]  [email or web address]

## 2023-08-03 DIAGNOSIS — M5442 Lumbago with sciatica, left side: Secondary | ICD-10-CM | POA: Diagnosis not present

## 2023-08-03 NOTE — Therapy (Deleted)
OUTPATIENT PHYSICAL THERAPY NEURO EVALUATION   Patient Name: Valerie Munoz MRN: [id number] DOB:1951-12-06, 72 y.o., female Today's Date: 08/03/2023   PCP: Marland Kitchen REFERRING PROVIDER: ***  END OF SESSION:   Past Medical History:  Diagnosis Date   Gout    Hospitalized for tx of Left foot 2017, per pt.   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   Patient Active Problem List   Diagnosis Date Noted   Chronic radicular lumbar pain 12/15/2022   Screening for colon cancer 11/16/2022   Adenomatous polyp of ascending colon 11/16/2022   Polyp of transverse colon 11/16/2022   Pre-op evaluation 11/10/2022   Hypertension 11/10/2022   Bilateral post-traumatic osteoarthritis of knee 07/05/2022   Chronic pain of both knees 06/09/2022   Neuropathic pain of both legs 06/09/2022   Chronic left shoulder pain 06/09/2022   Lumbar degenerative disc disease 06/09/2022   Lumbar facet arthropathy 06/09/2022   Arthritis of left glenohumeral joint 06/09/2022   Chronic pain syndrome 06/09/2022   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  Rationale for Evaluation and Treatment: {HABREHAB:27488}  SUBJECTIVE:                                                                                                                                                                                              SUBJECTIVE STATEMENT: *** Pt accompanied by: {accompnied:27141}  PERTINENT HISTORY: Patient has a history of hypertension, chronic pain syndrome, degenerative disc disease, chronic left shoulder pain, neuropathy bilateral legs, patient also has a automatic cardioverter/defibrillator.   PAIN:  Are you having pain? {OPRCPAIN:27236}  PRECAUTIONS: {Therapy precautions:24002}  RED FLAGS: {PT Red Flags:29287}   WEIGHT BEARING RESTRICTIONS: {Yes ***/No:24003}  FALLS: Has patient fallen in last 6 months? {fallsyesno:27318}  LIVING ENVIRONMENT: Lives with: {OPRC lives with:25569::"lives with their family"} Lives in: {Lives in:25570} Stairs: {opstairs:27293} Has  following equipment at home: {Assistive devices:23999}  PLOF: {PLOF:24004}  PATIENT GOALS: ***  OBJECTIVE:   DIAGNOSTIC FINDINGS: ***  COGNITION: Overall cognitive status: {cognition:24006}   SENSATION: {sensation:27233}  COORDINATION: ***  EDEMA:  {edema:24020}  MUSCLE TONE: {LE tone:25568}  MUSCLE LENGTH: Hamstrings: Right *** deg; Left *** deg Mcnutt test: Right *** deg; Left *** deg  DTRs:  {DTR SITE:24025}  POSTURE: {posture:25561}  LOWER EXTREMITY ROM:     {AROM/PROM:27142}  Right Eval Left Eval  Hip flexion    Hip extension    Hip abduction    Hip adduction    Hip internal rotation    Hip external rotation    Knee flexion    Knee extension    Ankle dorsiflexion    Ankle plantarflexion    Ankle inversion    Ankle eversion     (Blank rows = not tested)  LOWER EXTREMITY MMT:    MMT Right Eval Left Eval  Hip flexion    Hip extension    Hip abduction    Hip adduction    Hip internal rotation    Hip external rotation    Knee flexion    Knee extension    Ankle dorsiflexion    Ankle plantarflexion    Ankle inversion    Ankle eversion    (Blank rows =  not tested)  BED MOBILITY:  {Bed mobility:24027}  TRANSFERS: Assistive device utilized: {Assistive devices:23999}  Sit to stand: {Levels of assistance:24026} Stand to sit: {Levels of assistance:24026} Chair to chair: {Levels of assistance:24026} Floor: {Levels of assistance:24026}  RAMP:  Level of Assistance: {Levels of assistance:24026} Assistive device utilized: {Assistive devices:23999} Ramp Comments: ***  CURB:  Level of Assistance: {Levels of assistance:24026} Assistive device utilized: {Assistive devices:23999} Curb Comments: ***  STAIRS: Level of Assistance: {Levels of assistance:24026} Stair Negotiation Technique: {Stair Technique:27161} with {Rail Assistance:27162} Number of Stairs: ***  Height of Stairs: ***  Comments: ***  GAIT: Gait pattern: {gait characteristics:25376} Distance walked: *** Assistive device utilized: {Assistive devices:23999} Level of assistance: {Levels of assistance:24026} Comments: ***  FUNCTIONAL TESTS:  {Functional tests:24029}  PATIENT SURVEYS:  {rehab surveys:24030}  TODAY'S TREATMENT:                                                                                                                              DATE: ***    PATIENT EDUCATION: Education details: *** Person educated: {Person educated:25204} Education method: {Education Method:25205} Education comprehension: {Education Comprehension:25206}  HOME EXERCISE PROGRAM: ***  GOALS: Goals reviewed with patient? {yes/no:20286}  SHORT TERM GOALS: Target date: ***  *** Baseline: Goal status: INITIAL  2.  *** Baseline:  Goal status: INITIAL  3.  *** Baseline:  Goal status: INITIAL  4.  *** Baseline:  Goal status: INITIAL  5.  *** Baseline:  Goal status: INITIAL  6.  *** Baseline:  Goal status: INITIAL  LONG TERM GOALS: Target date: ***  *** Baseline:  Goal status: INITIAL  2.  ***  Baseline:  Goal status: INITIAL  3.  *** Baseline:  Goal  status: INITIAL  4.  *** Baseline:  Goal status: INITIAL  5.  *** Baseline:  Goal status: INITIAL  6.  *** Baseline:  Goal status: INITIAL  ASSESSMENT:  CLINICAL IMPRESSION: Patient is a *** y.o. *** who was seen today for physical therapy evaluation and treatment for ***.   OBJECTIVE IMPAIRMENTS: {opptimpairments:25111}.   ACTIVITY LIMITATIONS: {activitylimitations:27494}  PARTICIPATION LIMITATIONS: {participationrestrictions:25113}  PERSONAL FACTORS: {Personal factors:25162} are also affecting patient's functional outcome.   REHAB POTENTIAL: {rehabpotential:25112}  CLINICAL DECISION MAKING: {clinical decision making:25114}  EVALUATION COMPLEXITY: {Evaluation complexity:25115}  PLAN:  PT FREQUENCY: {rehab frequency:25116}  PT DURATION: {rehab duration:25117}  PLANNED INTERVENTIONS: {rehab planned interventions:25118::"Therapeutic exercises","Therapeutic activity","Neuromuscular re-education","Balance training","Gait training","Patient/Family education","Self Care","Joint mobilization"}  PLAN FOR NEXT SESSION: ***   Norman Herrlich, PT 08/03/2023, 9:18 AM

## 2023-08-04 ENCOUNTER — Ambulatory Visit: Payer: 59 | Admitting: Physical Therapy

## 2023-08-08 ENCOUNTER — Ambulatory Visit: Payer: 59 | Attending: Internal Medicine

## 2023-08-08 NOTE — Therapy (Deleted)
OUTPATIENT PHYSICAL THERAPY NEURO EVALUATION   Patient Name: Valerie Munoz MRN: [id number] DOB:[birth year], 72 y.o., female Today's Date: 08/08/2023   PCP: Corky Downs, MD  REFERRING PROVIDER:   Morene Crocker, MD    END OF SESSION:   Past Medical History:  Diagnosis Date   Gout    Hospitalized for tx of Left foot 2017, per pt.   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   Patient Active Problem List   Diagnosis Date Noted   Chronic radicular lumbar pain 12/15/2022   Screening for colon cancer 11/16/2022   Adenomatous polyp of ascending colon 11/16/2022   Polyp of transverse colon 11/16/2022   Pre-op evaluation 11/10/2022   Hypertension 11/10/2022   Bilateral post-traumatic osteoarthritis of knee 07/05/2022   Chronic pain of both knees 06/09/2022   Neuropathic pain of both legs 06/09/2022   Chronic left shoulder pain 06/09/2022   Lumbar degenerative disc disease 06/09/2022   Lumbar facet arthropathy 06/09/2022   Arthritis of left glenohumeral joint 06/09/2022   Chronic pain syndrome 06/09/2022   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  Rationale for Evaluation and Treatment: Rehabilitation  SUBJECTIVE:                                                                                                                                                                                             SUBJECTIVE STATEMENT: *** Pt accompanied by: {accompnied:27141}  PERTINENT HISTORY: ***  PAIN:  Are you having pain? {OPRCPAIN:27236}  PRECAUTIONS: {Therapy precautions:24002}  RED FLAGS: {PT Red Flags:29287}   WEIGHT BEARING RESTRICTIONS: {Yes ***/No:24003}  FALLS: Has patient fallen in last 6 months? {fallsyesno:27318}  LIVING ENVIRONMENT: Lives with: {OPRC lives with:25569::"lives with their family"} Lives in: {Lives in:25570} Stairs: {opstairs:27293} Has following equipment  at home: {Assistive devices:23999}  PLOF: {PLOF:24004}  PATIENT GOALS: ***  OBJECTIVE:   DIAGNOSTIC FINDINGS: ***  COGNITION: Overall cognitive status: {cognition:24006}   SENSATION: {sensation:27233}  COORDINATION: ***  EDEMA:  {edema:24020}  MUSCLE TONE: {LE tone:25568}  MUSCLE LENGTH: Hamstrings: Right *** deg; Left *** deg Christenberry test: Right *** deg; Left *** deg  DTRs:  {DTR SITE:24025}  POSTURE: {posture:25561}  LOWER EXTREMITY ROM:     {AROM/PROM:27142}  Right Eval Left Eval  Hip flexion    Hip extension    Hip abduction    Hip adduction    Hip internal rotation    Hip external rotation    Knee flexion    Knee extension    Ankle dorsiflexion    Ankle plantarflexion    Ankle inversion    Ankle eversion     (Blank rows = not tested)  LOWER EXTREMITY MMT:    MMT Right Eval Left Eval  Hip flexion    Hip extension    Hip abduction    Hip adduction    Hip internal rotation    Hip external rotation    Knee flexion    Knee extension    Ankle dorsiflexion    Ankle plantarflexion    Ankle inversion    Ankle eversion    (Blank rows = not tested)  BED MOBILITY:  {Bed  mobility:24027}  TRANSFERS: Assistive device utilized: {Assistive devices:23999}  Sit to stand: {Levels of assistance:24026} Stand to sit: {Levels of assistance:24026} Chair to chair: {Levels of assistance:24026} Floor: {Levels of assistance:24026}  RAMP:  Level of Assistance: {Levels of assistance:24026} Assistive device utilized: {Assistive devices:23999} Ramp Comments: ***  CURB:  Level of Assistance: {Levels of assistance:24026} Assistive device utilized: {Assistive devices:23999} Curb Comments: ***  STAIRS: Level of Assistance: {Levels of assistance:24026} Stair Negotiation Technique: {Stair Technique:27161} with {Rail Assistance:27162} Number of Stairs: ***  Height of Stairs: ***  Comments: ***  GAIT: Gait pattern: {gait characteristics:25376} Distance walked: *** Assistive device utilized: {Assistive devices:23999} Level of assistance: {Levels of assistance:24026} Comments: ***  FUNCTIONAL TESTS:  {Functional tests:24029}  PATIENT SURVEYS:  {rehab surveys:24030}  TODAY'S TREATMENT:                                                                                                                              DATE: ***    PATIENT EDUCATION: Education details: *** Person educated: {Person educated:25204} Education method: {Education Method:25205} Education comprehension: {Education Comprehension:25206}  HOME EXERCISE PROGRAM: ***  GOALS: Goals reviewed with patient? {yes/no:20286}  SHORT TERM GOALS: Target date: ***  Patient will be independent in home exercise program to improve strength/mobility for better functional independence with ADLs. Baseline: Goal status: {GOALSTATUS:25110}   LONG TERM GOALS: Target date: ***  Patient will increase FOTO score to equal to or greater than     to demonstrate statistically significant improvement in mobility and quality of life.  Baseline:  Goal status: {GOALSTATUS:25110}  2.  Patient (> 60  years old) will  complete five times sit to stand test in < 15 seconds indicating an increased LE strength and improved balance. Baseline:  Goal status: {GOALSTATUS:25110}  3.  Patient will increase Berg Balance score by > 6 points to demonstrate decreased fall risk during functional activities Baseline:  Goal status: {GOALSTATUS:25110}  4.  Patient will increase 10 meter walk test to >1.72m/s as to improve gait speed for better community ambulation and to reduce fall risk. Baseline:  Goal status: {GOALSTATUS:25110}  5.  Patient will reduce timed up and go to <11 seconds to reduce fall risk and demonstrate improved transfer/gait ability. Baseline:  Goal status: {GOALSTATUS:25110}  6.  Patient will increase dynamic gait index score to >19/24 as to demonstrate reduced fall risk and improved dynamic gait balance for better safety with community/home ambulation.   Baseline:  Goal status: {GOALSTATUS:25110}    ASSESSMENT:  CLINICAL IMPRESSION: Patient is a *** y.o. *** who was seen today for physical therapy evaluation and treatment for ***.   OBJECTIVE IMPAIRMENTS: {opptimpairments:25111}.   ACTIVITY LIMITATIONS: {activitylimitations:27494}  PARTICIPATION LIMITATIONS: {participationrestrictions:25113}  PERSONAL FACTORS: {Personal factors:25162} are also affecting patient's functional outcome.   REHAB POTENTIAL: {rehabpotential:25112}  CLINICAL DECISION MAKING: {clinical decision making:25114}  EVALUATION COMPLEXITY: {Evaluation complexity:25115}  PLAN:  PT FREQUENCY: {rehab frequency:25116}  PT DURATION: {rehab duration:25117}  PLANNED INTERVENTIONS: {rehab planned interventions:25118::"Therapeutic exercises","Therapeutic activity","Neuromuscular re-education","Balance training","Gait training","Patient/Family education","Self Care","Joint mobilization"}  PLAN FOR NEXT SESSION: ***   Baird Kay, PT 08/08/2023, 11:04 AM

## 2023-08-10 ENCOUNTER — Ambulatory Visit: Payer: 59

## 2023-08-15 IMAGING — CT CT CERVICAL SPINE W/O CM
3 of 4 series · 12 of 33 positions shown, 14 images · non-contrast
Comparison: None.

CLINICAL DATA: Neck trauma (Age >= 65y)



[Series 6: sag bone · sagittal · 0.34mm/px · 5 of 103 slices shown, 6 images]
[im 35/103  bone]
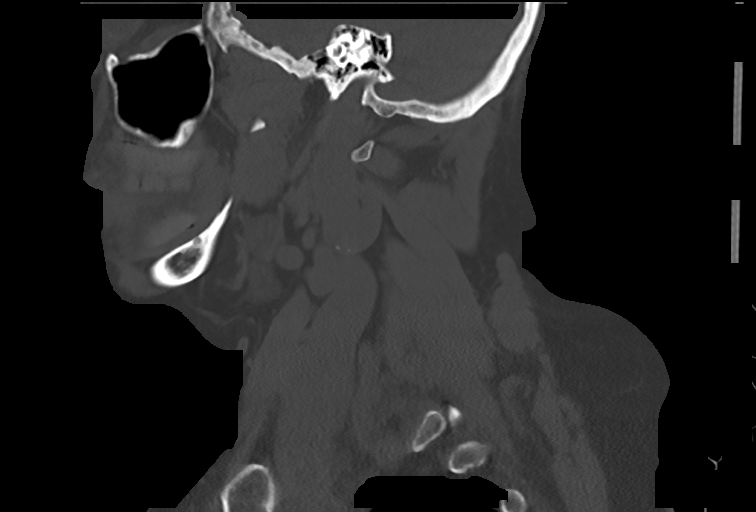
[im 43/103  bone]
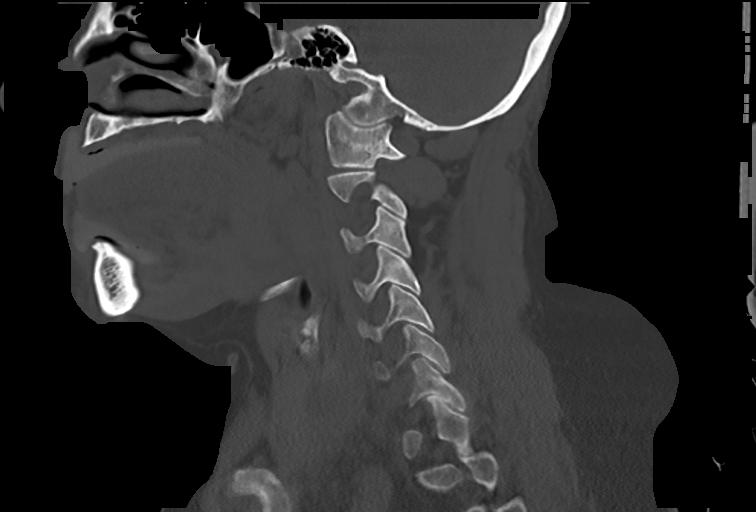
[im 52/103  soft-tissue]
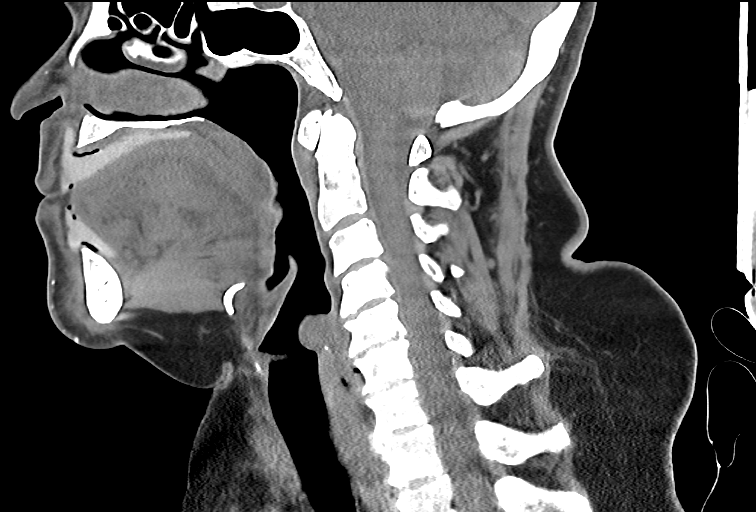
[im 52/103  bone]
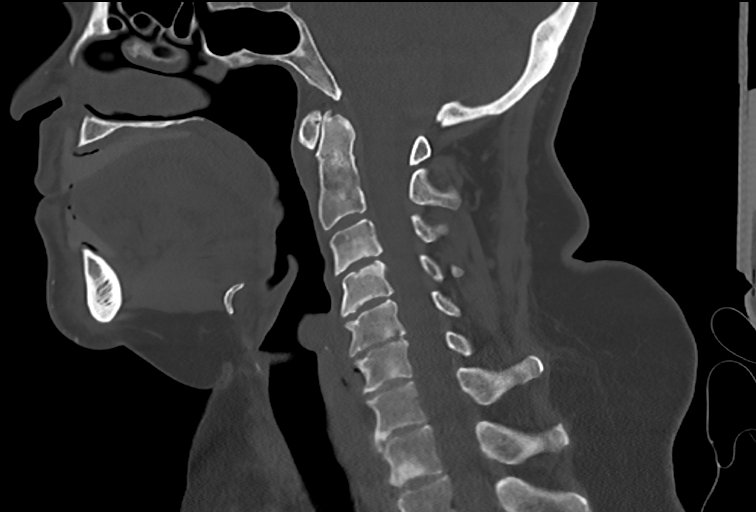
[im 60/103  bone]
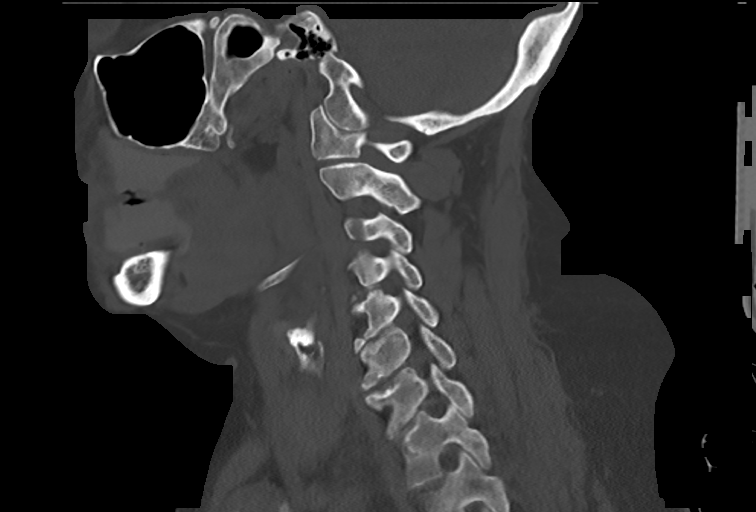
[im 69/103  bone]
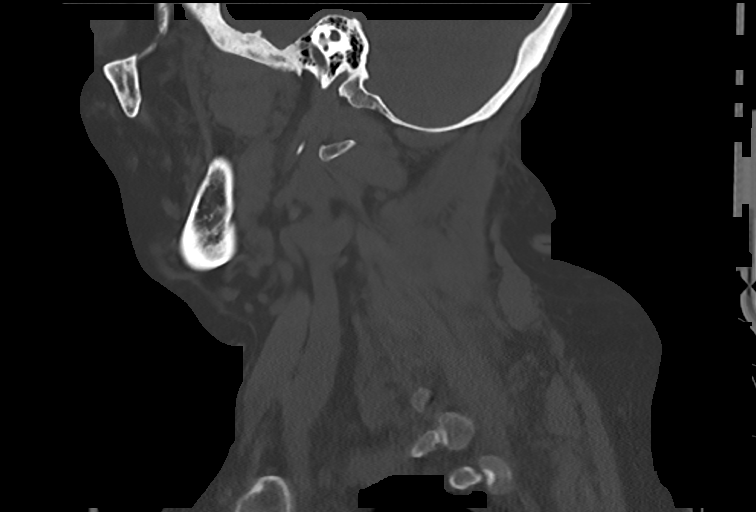

[Series 7: cor bone · coronal · 0.34mm/px · 3 of 80 slices shown]
[im 16/80  bone]
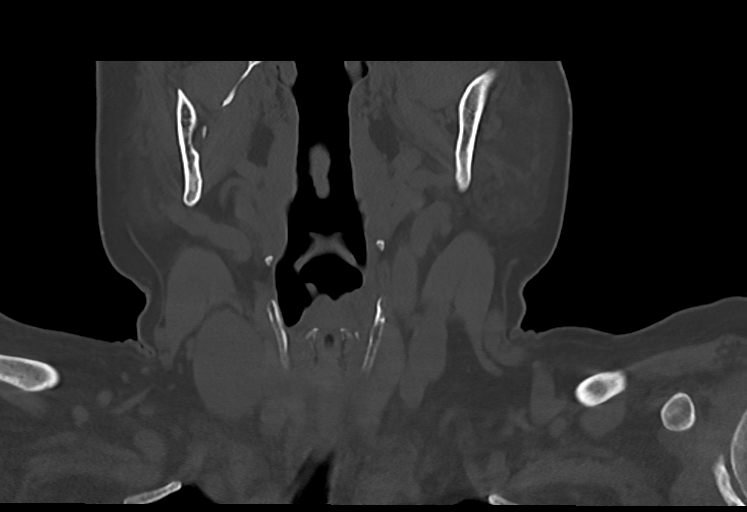
[im 32/80  bone]
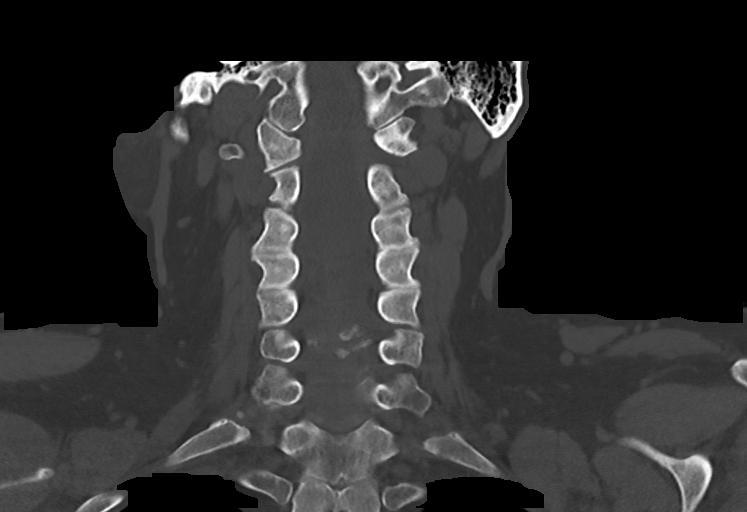
[im 48/80  bone]
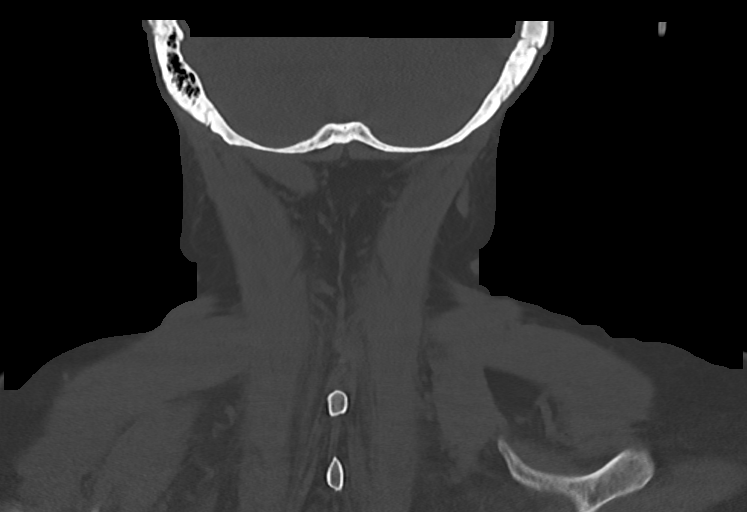

[Series 8: orthogonal axials · axial · 0.23mm/px · z∈[-255,-143]mm · 4 of 87 slices shown, 5 images]
[im 15/87  soft-tissue]
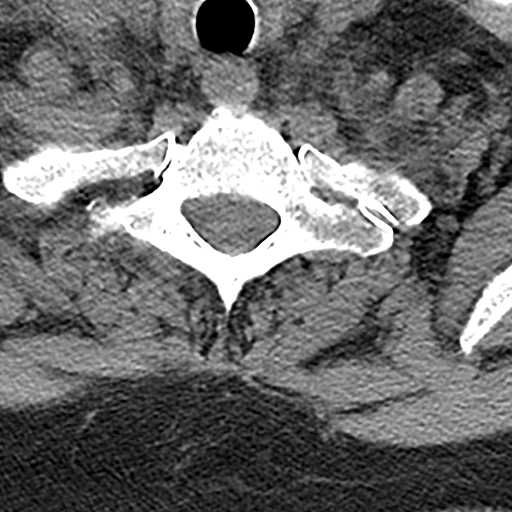
[im 15/87  bone]
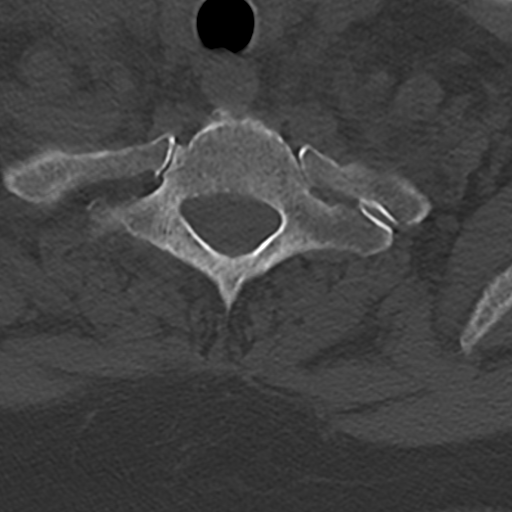
[im 29/87  bone]
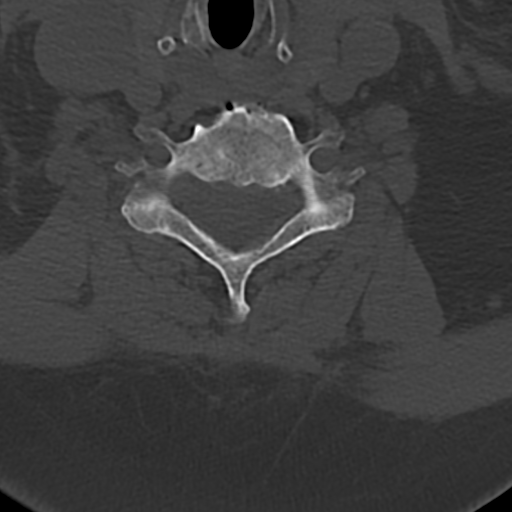
[im 58/87  bone]
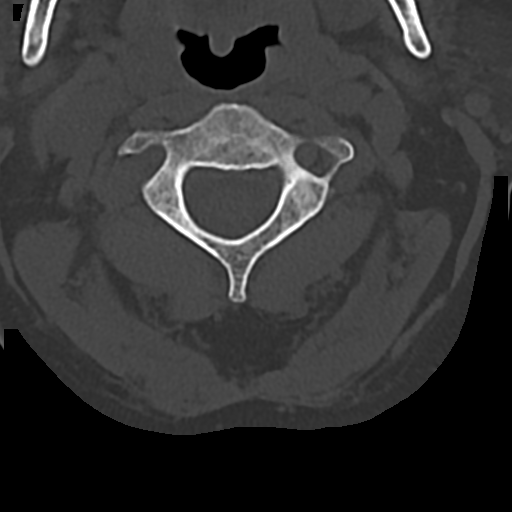
[im 72/87  bone]
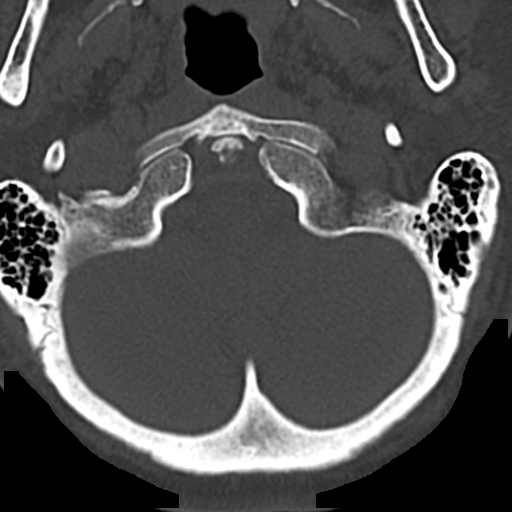

[12 of 33 positions shown; findings below may reference images not displayed]

FINDINGS: Alignment: Straightening. No substantial sagittal subluxation. Mild
broad dextrocurvature.

Skull base and vertebrae: Vertebral body heights are maintained. No
evidence of acute fracture.

Soft tissues and spinal canal: No prevertebral fluid or swelling. No
visible canal hematoma.

Disc levels: Moderate multilevel degenerative disc disease,
including disc height loss, endplate sclerosis and endplate
spurring.

Upper chest: Visualized lung apices are clear.

Other: Approximately 1.5 cm right thyroid nodule.
IMPRESSION: 1. No evidence of acute fracture or traumatic malalignment.
2. Moderate multilevel degenerative disc disease.
3. Approximately 1.5 cm right thyroid nodule. Recommend thyroid
ultrasound (ref: [HOSPITAL]. [DATE]): 143-50).

## 2023-08-17 ENCOUNTER — Ambulatory Visit: Payer: 59

## 2023-08-19 DIAGNOSIS — M5442 Lumbago with sciatica, left side: Secondary | ICD-10-CM | POA: Diagnosis not present

## 2023-08-19 DIAGNOSIS — G8929 Other chronic pain: Secondary | ICD-10-CM | POA: Diagnosis not present

## 2023-08-22 ENCOUNTER — Ambulatory Visit: Payer: 59

## 2023-08-24 ENCOUNTER — Ambulatory Visit: Payer: 59

## 2023-08-29 ENCOUNTER — Ambulatory Visit: Payer: 59

## 2023-08-31 ENCOUNTER — Ambulatory Visit: Payer: 59

## 2023-09-05 ENCOUNTER — Ambulatory Visit: Payer: 59

## 2023-09-06 DIAGNOSIS — M546 Pain in thoracic spine: Secondary | ICD-10-CM | POA: Diagnosis not present

## 2023-09-06 DIAGNOSIS — G8929 Other chronic pain: Secondary | ICD-10-CM | POA: Diagnosis not present

## 2023-09-06 DIAGNOSIS — M5442 Lumbago with sciatica, left side: Secondary | ICD-10-CM | POA: Diagnosis not present

## 2023-09-06 DIAGNOSIS — M5416 Radiculopathy, lumbar region: Secondary | ICD-10-CM | POA: Diagnosis not present

## 2023-09-06 DIAGNOSIS — M5414 Radiculopathy, thoracic region: Secondary | ICD-10-CM | POA: Diagnosis not present

## 2023-09-06 DIAGNOSIS — M5441 Lumbago with sciatica, right side: Secondary | ICD-10-CM | POA: Diagnosis not present

## 2023-09-07 ENCOUNTER — Ambulatory Visit: Payer: 59

## 2023-09-09 DIAGNOSIS — G8929 Other chronic pain: Secondary | ICD-10-CM | POA: Diagnosis not present

## 2023-09-09 DIAGNOSIS — M5442 Lumbago with sciatica, left side: Secondary | ICD-10-CM | POA: Diagnosis not present

## 2023-09-12 ENCOUNTER — Ambulatory Visit: Payer: 59

## 2023-09-14 ENCOUNTER — Ambulatory Visit: Payer: 59

## 2023-09-19 ENCOUNTER — Ambulatory Visit: Payer: 59

## 2023-09-21 ENCOUNTER — Ambulatory Visit: Payer: 59

## 2023-09-26 ENCOUNTER — Ambulatory Visit: Payer: 59

## 2023-09-28 ENCOUNTER — Ambulatory Visit: Payer: 59

## 2023-10-03 ENCOUNTER — Ambulatory Visit: Payer: 59

## 2023-10-05 ENCOUNTER — Ambulatory Visit: Payer: 59

## 2023-10-10 ENCOUNTER — Ambulatory Visit: Payer: 59

## 2023-10-12 ENCOUNTER — Ambulatory Visit: Payer: 59

## 2023-10-17 ENCOUNTER — Ambulatory Visit: Payer: 59

## 2023-10-19 ENCOUNTER — Ambulatory Visit: Payer: 59

## 2023-10-24 ENCOUNTER — Ambulatory Visit: Payer: 59

## 2023-10-24 DIAGNOSIS — M545 Low back pain, unspecified: Secondary | ICD-10-CM | POA: Diagnosis not present

## 2023-10-26 ENCOUNTER — Ambulatory Visit: Payer: 59

## 2023-10-31 ENCOUNTER — Ambulatory Visit: Payer: 59

## 2023-11-02 ENCOUNTER — Ambulatory Visit: Payer: 59

## 2023-11-07 ENCOUNTER — Ambulatory Visit: Payer: 59

## 2023-11-09 ENCOUNTER — Ambulatory Visit: Payer: 59

## 2023-11-14 ENCOUNTER — Ambulatory Visit: Payer: 59

## 2023-11-16 ENCOUNTER — Ambulatory Visit: Payer: 59

## 2023-11-17 DIAGNOSIS — M5442 Lumbago with sciatica, left side: Secondary | ICD-10-CM | POA: Diagnosis not present

## 2023-11-21 ENCOUNTER — Ambulatory Visit: Payer: 59

## 2023-11-23 ENCOUNTER — Ambulatory Visit: Payer: 59

## 2023-11-23 DIAGNOSIS — M5442 Lumbago with sciatica, left side: Secondary | ICD-10-CM | POA: Diagnosis not present

## 2023-11-23 DIAGNOSIS — G8929 Other chronic pain: Secondary | ICD-10-CM | POA: Diagnosis not present

## 2023-11-23 DIAGNOSIS — M5416 Radiculopathy, lumbar region: Secondary | ICD-10-CM | POA: Diagnosis not present

## 2023-11-28 ENCOUNTER — Ambulatory Visit: Payer: 59

## 2023-11-30 ENCOUNTER — Ambulatory Visit: Payer: 59

## 2023-12-15 DIAGNOSIS — M5416 Radiculopathy, lumbar region: Secondary | ICD-10-CM | POA: Diagnosis not present

## 2024-01-02 DIAGNOSIS — H2513 Age-related nuclear cataract, bilateral: Secondary | ICD-10-CM | POA: Diagnosis not present

## 2024-02-09 DIAGNOSIS — R600 Localized edema: Secondary | ICD-10-CM | POA: Diagnosis not present

## 2024-02-09 DIAGNOSIS — M79674 Pain in right toe(s): Secondary | ICD-10-CM | POA: Diagnosis not present

## 2024-02-09 DIAGNOSIS — L608 Other nail disorders: Secondary | ICD-10-CM | POA: Diagnosis not present

## 2024-02-09 DIAGNOSIS — B351 Tinea unguium: Secondary | ICD-10-CM | POA: Diagnosis not present

## 2024-02-09 DIAGNOSIS — G629 Polyneuropathy, unspecified: Secondary | ICD-10-CM | POA: Diagnosis not present

## 2024-02-09 DIAGNOSIS — M79675 Pain in left toe(s): Secondary | ICD-10-CM | POA: Diagnosis not present

## 2024-02-14 DIAGNOSIS — I159 Secondary hypertension, unspecified: Secondary | ICD-10-CM | POA: Diagnosis not present

## 2024-02-14 DIAGNOSIS — R7303 Prediabetes: Secondary | ICD-10-CM | POA: Diagnosis not present

## 2024-02-14 DIAGNOSIS — N1832 Chronic kidney disease, stage 3b: Secondary | ICD-10-CM | POA: Diagnosis not present

## 2024-02-14 DIAGNOSIS — L308 Other specified dermatitis: Secondary | ICD-10-CM | POA: Diagnosis not present

## 2024-02-14 DIAGNOSIS — Z95810 Presence of automatic (implantable) cardiac defibrillator: Secondary | ICD-10-CM | POA: Diagnosis not present

## 2024-02-14 DIAGNOSIS — G629 Polyneuropathy, unspecified: Secondary | ICD-10-CM | POA: Diagnosis not present

## 2024-03-09 DIAGNOSIS — Z03818 Encounter for observation for suspected exposure to other biological agents ruled out: Secondary | ICD-10-CM | POA: Diagnosis not present

## 2024-03-09 DIAGNOSIS — J019 Acute sinusitis, unspecified: Secondary | ICD-10-CM | POA: Diagnosis not present

## 2024-03-09 DIAGNOSIS — B9689 Other specified bacterial agents as the cause of diseases classified elsewhere: Secondary | ICD-10-CM | POA: Diagnosis not present

## 2024-03-19 ENCOUNTER — Emergency Department

## 2024-03-19 ENCOUNTER — Other Ambulatory Visit: Payer: Self-pay

## 2024-03-19 ENCOUNTER — Emergency Department
Admission: EM | Admit: 2024-03-19 | Discharge: 2024-03-19 | Disposition: A | Discharge status: Home / Self Care | Attending: Emergency Medicine | Admitting: Emergency Medicine

## 2024-03-19 DIAGNOSIS — Z950 Presence of cardiac pacemaker: Secondary | ICD-10-CM | POA: Diagnosis not present

## 2024-03-19 DIAGNOSIS — M5030 Other cervical disc degeneration, unspecified cervical region: Secondary | ICD-10-CM | POA: Diagnosis not present

## 2024-03-19 DIAGNOSIS — W19XXXA Unspecified fall, initial encounter: Secondary | ICD-10-CM | POA: Insufficient documentation

## 2024-03-19 DIAGNOSIS — R051 Acute cough: Secondary | ICD-10-CM | POA: Insufficient documentation

## 2024-03-19 DIAGNOSIS — I10 Essential (primary) hypertension: Secondary | ICD-10-CM | POA: Diagnosis not present

## 2024-03-19 DIAGNOSIS — R42 Dizziness and giddiness: Secondary | ICD-10-CM | POA: Insufficient documentation

## 2024-03-19 DIAGNOSIS — R059 Cough, unspecified: Secondary | ICD-10-CM | POA: Diagnosis not present

## 2024-03-19 DIAGNOSIS — G9389 Other specified disorders of brain: Secondary | ICD-10-CM | POA: Diagnosis not present

## 2024-03-19 DIAGNOSIS — I700 Atherosclerosis of aorta: Secondary | ICD-10-CM | POA: Diagnosis not present

## 2024-03-19 DIAGNOSIS — R1110 Vomiting, unspecified: Secondary | ICD-10-CM | POA: Diagnosis not present

## 2024-03-19 LAB — COMPREHENSIVE METABOLIC PANEL WITH GFR
ALT: 15 U/L (ref 0–44)
AST: 18 U/L (ref 15–41)
Albumin: 4 g/dL (ref 3.5–5.0)
Alkaline Phosphatase: 70 U/L (ref 38–126)
Anion gap: 8 (ref 5–15)
BUN: 31 mg/dL — ABNORMAL HIGH (ref 8–23)
CO2: 23 mmol/L (ref 22–32)
Calcium: 8.9 mg/dL (ref 8.9–10.3)
Chloride: 110 mmol/L (ref 98–111)
Creatinine, Ser: 1.45 mg/dL — ABNORMAL HIGH (ref 0.44–1.00)
GFR, Estimated: 38 mL/min — ABNORMAL LOW (ref >60)
Glucose, Bld: 109 mg/dL — ABNORMAL HIGH (ref 70–99)
Potassium: 4.2 mmol/L (ref 3.5–5.1)
Sodium: 141 mmol/L (ref 135–145)
Total Bilirubin: 1 mg/dL (ref 0.0–1.2)
Total Protein: 7.2 g/dL (ref 6.5–8.1)

## 2024-03-19 LAB — CBC
HCT: 32.1 % — ABNORMAL LOW (ref 36.0–46.0)
Hemoglobin: 10.9 g/dL — ABNORMAL LOW (ref 12.0–15.0)
MCH: 31.4 pg (ref 26.0–34.0)
MCHC: 34 g/dL (ref 30.0–36.0)
MCV: 92.5 fL (ref 80.0–100.0)
Platelets: 190 10*3/uL (ref 150–400)
RBC: 3.47 MIL/uL — ABNORMAL LOW (ref 3.87–5.11)
RDW: 12.4 % (ref 11.5–15.5)
WBC: 8.1 10*3/uL (ref 4.0–10.5)
nRBC: 0 % (ref 0.0–0.2)

## 2024-03-19 NOTE — ED Triage Notes (Signed)
 First nurse note: Brought from [facility]. Reports Thursday April 3rd she had emesis and three falls. Similar episode in March.  C/o pain in bilateral shoulders today. Denies LOC from falls   [facility] vitals: 136/72 b/p 76HR 97% RA 98.1 oral

## 2024-03-19 NOTE — ED Notes (Signed)
 See triage note  Presents s/p falls  States she has had several falls  But fell again on Friday  Did have 1 episode of vomiting at that time  Bruising noted to post right shoulder

## 2024-03-19 NOTE — ED Provider Notes (Signed)
 [facility] Provider Note    Event Date/Time   First MD Initiated Contact with Patient 03/19/24 1026     (approximate)   History   Fall   HPI  Valerie Munoz is a 73 y.o. female history of heart disease, hypertension, neuropathy presents emergency department after a fall over the weekend.  Patient states she stood up got very dizzy had vomiting and urinated on herself.  States she had just started taking cefdinir and is unsure if this caused the symptoms.  Has not had any symptoms of dizziness since stopping the cefdinir.  Denies headache at this time.  No vomiting.  States she was put on antibiotic as she felt bad the previous week and had a rattling in her chest.  Was not tested for flu or COVID.  Denies fevers chills      Physical Exam   Triage Vital Signs: ED Triage Vitals  Encounter Vitals Group     BP 03/19/24 0949 (!) 145/86     Systolic BP Percentile --      Diastolic BP Percentile --      Pulse Rate 03/19/24 0949 63     Resp 03/19/24 0949 16     Temp 03/19/24 0949 98.3 F (36.8 C)     Temp Source 03/19/24 0949 Oral     SpO2 03/19/24 0949 97 %     Weight --      Height --      Head Circumference --      Peak Flow --      Pain Score 03/19/24 0956 8     Pain Loc --      Pain Education --      Exclude from Growth Chart --     Most recent vital signs: Vitals:   03/19/24 0949  BP: (!) 145/86  Pulse: 63  Resp: 16  Temp: 98.3 F (36.8 C)  SpO2: 97%     General: Awake, no distress.   CV:  Good peripheral perfusion. regular rate and  rhythm Resp:  Normal effort. Lungs CTA Abd:  No distention.   Other:      ED Results / Procedures / Treatments   Labs (all labs ordered are listed, but only abnormal results are displayed) Labs Reviewed  CBC - Abnormal; Notable for the following components:      Result Value   RBC 3.47 (*)    Hemoglobin 10.9 (*)    HCT 32.1 (*)    All other components within normal limits  COMPREHENSIVE  METABOLIC PANEL WITH GFR - Abnormal; Notable for the following components:   Glucose, Bld 109 (*)    BUN 31 (*)    Creatinine, Ser 1.45 (*)    GFR, Estimated 38 (*)    All other components within normal limits     EKG     RADIOLOGY CT of the head, C-spine, chest x-ray    PROCEDURES:   Procedures Chief Complaint  Patient presents with   Fall      MEDICATIONS ORDERED IN ED: Medications - No data to display   IMPRESSION / MDM / ASSESSMENT AND PLAN / ED COURSE  I reviewed the triage vital signs and the nursing notes.                              Differential diagnosis includes, but is not limited to, CVA, subdural, SAH, medication reaction, CAP, vertigo  Patient's  presentation is most consistent with acute illness / injury with system symptoms.   Patient is asymptomatic at this time.  CT of the head cervical spine independently reviewed interpreted by me as being negative for any acute abnormality.  Chest x-ray  Labs are reassuring  Chest x-ray independently reviewed interpreted by me as being reassuring  Since patient is feeling well I do not feel that we should place her on antibiotic at this time.  She is to follow-up with her regular doctor.  If worsening return emergency department.  She is in agreement treatment plan.  Discharged stable condition.      FINAL CLINICAL IMPRESSION(S) / ED DIAGNOSES   Final diagnoses:  Fall, initial encounter  Acute cough     Rx / DC Orders   ED Discharge Orders     None        Note:  This document was prepared using Dragon voice recognition software and may include unintentional dictation errors.    Faythe Ghee, PA-C 03/19/24 1407    Jene Every, MD 03/19/24 [phone]

## 2024-03-19 NOTE — ED Triage Notes (Signed)
 Pt here after multiple falls. Pt states she has been getting dizzy lately and falling a lot. Pt states she threw up Friday thinking it may the medication she was taking, Cefdinir 300mg . Pt unsure if she hit her head during any of the falls but does c/o a headache and bilateral shoulder pain. Pt has a large bruise to her right back shoulder. Pt denies nausea or diarrhea but endorses vomiting.

## 2024-03-26 DIAGNOSIS — R42 Dizziness and giddiness: Secondary | ICD-10-CM | POA: Diagnosis not present

## 2024-03-26 DIAGNOSIS — R300 Dysuria: Secondary | ICD-10-CM | POA: Diagnosis not present

## 2024-03-26 DIAGNOSIS — R296 Repeated falls: Secondary | ICD-10-CM | POA: Diagnosis not present

## 2024-03-27 DIAGNOSIS — M5442 Lumbago with sciatica, left side: Secondary | ICD-10-CM | POA: Diagnosis not present

## 2024-03-27 DIAGNOSIS — M5416 Radiculopathy, lumbar region: Secondary | ICD-10-CM | POA: Diagnosis not present

## 2024-03-27 DIAGNOSIS — G8929 Other chronic pain: Secondary | ICD-10-CM | POA: Diagnosis not present

## 2024-03-27 DIAGNOSIS — M25512 Pain in left shoulder: Secondary | ICD-10-CM | POA: Diagnosis not present

## 2024-04-09 DIAGNOSIS — M5416 Radiculopathy, lumbar region: Secondary | ICD-10-CM | POA: Diagnosis not present

## 2024-04-17 DIAGNOSIS — R296 Repeated falls: Secondary | ICD-10-CM | POA: Diagnosis not present

## 2024-04-17 DIAGNOSIS — G5793 Unspecified mononeuropathy of bilateral lower limbs: Secondary | ICD-10-CM | POA: Diagnosis not present

## 2024-04-17 DIAGNOSIS — N1832 Chronic kidney disease, stage 3b: Secondary | ICD-10-CM | POA: Diagnosis not present

## 2024-04-17 DIAGNOSIS — M51369 Other intervertebral disc degeneration, lumbar region without mention of lumbar back pain or lower extremity pain: Secondary | ICD-10-CM | POA: Diagnosis not present

## 2024-04-17 DIAGNOSIS — Z95810 Presence of automatic (implantable) cardiac defibrillator: Secondary | ICD-10-CM | POA: Diagnosis not present

## 2024-04-17 DIAGNOSIS — R7303 Prediabetes: Secondary | ICD-10-CM | POA: Diagnosis not present

## 2024-04-23 DIAGNOSIS — B353 Tinea pedis: Secondary | ICD-10-CM | POA: Diagnosis not present

## 2024-04-23 DIAGNOSIS — B351 Tinea unguium: Secondary | ICD-10-CM | POA: Diagnosis not present

## 2024-04-24 DIAGNOSIS — M5442 Lumbago with sciatica, left side: Secondary | ICD-10-CM | POA: Diagnosis not present

## 2024-04-24 DIAGNOSIS — M25512 Pain in left shoulder: Secondary | ICD-10-CM | POA: Diagnosis not present

## 2024-04-24 DIAGNOSIS — M5416 Radiculopathy, lumbar region: Secondary | ICD-10-CM | POA: Diagnosis not present

## 2024-04-24 DIAGNOSIS — G8929 Other chronic pain: Secondary | ICD-10-CM | POA: Diagnosis not present

## 2024-05-22 NOTE — Progress Notes (Deleted)
  Electrophysiology Office Follow up Visit Note:    Date:  05/22/2024   ID:  Valerie Munoz, DOB 1951-01-04, MRN [id number]  PCP:  Will Hare, NP  [facility] Cardiologist:  None  [facility] Electrophysiologist:  Boyce Byes, MD    Interval History:     Valerie Munoz is a 73 y.o. female who presents for a follow up visit.   I last saw her 01/05/2023 for her ICD.  She was to establish for remotem onitoring but has not done so. She presents again today for routine follow up.        Past medical, surgical, social and family history were reviewed.  ROS:   Please see the history of present illness.    All other systems reviewed and are negative.  EKGs/Labs/Other Studies Reviewed:    The following studies were reviewed today:   05/23/2024 in clinic device interrogation personally reviewed ***  CXR shows dual coil ICD, CRT-D with the CS lead in the AIV.        Physical Exam:    VS:  There were no vitals taken for this visit.    Wt Readings from Last 3 Encounters:  03/19/24 171 lb 1.2 oz (77.6 kg)  07/24/23 171 lb 1.2 oz (77.6 kg)  06/07/23 171 lb (77.6 kg)     GEN: no distress CARD: RRR, No MRG. CIED pocket well healed. RESP: No IWOB. CTAB.      ASSESSMENT:    No diagnosis found. PLAN:    In order of problems listed above:  #Chronic systolic heart failure #ICD in situ Device functioning appropriately, needs to establish for remote monitoring.   #Hypertension *** goal today.  Recommend checking blood pressures 1-2 times per week at home and recording the values.  Recommend bringing these recordings to the primary care physician.  #AVNRT Cont BB   Follow up 1 year with APP.   Signed, Harvie Liner, MD, [facility], [facility] 05/22/2024 8:33 PM    Electrophysiology [facility]

## 2024-05-23 ENCOUNTER — Ambulatory Visit: Attending: Cardiology | Admitting: Cardiology

## 2024-05-23 DIAGNOSIS — I5022 Chronic systolic (congestive) heart failure: Secondary | ICD-10-CM

## 2024-05-23 DIAGNOSIS — I10 Essential (primary) hypertension: Secondary | ICD-10-CM

## 2024-05-23 DIAGNOSIS — Z95810 Presence of automatic (implantable) cardiac defibrillator: Secondary | ICD-10-CM

## 2024-10-09 NOTE — Progress Notes (Deleted)
  Electrophysiology Office Follow up Visit Note:    Date:  10/09/2024   ID:  Valerie Munoz, DOB 1951-09-08, MRN [id number]  PCP:  Valerie Gaetana CROME, NP  [facility] Cardiologist:  None  [facility] Electrophysiologist:  Valerie ONEIDA HOLTS, MD    Interval History:     Valerie Munoz is a 73 y.o. female who presents for a follow up visit.   I last saw the patient in January 2024.  Patient has an ICD.  The patient also sees [facility] for general cardiology care.  After our appointment, it appears the patient was lost to follow-up and has not established remote monitoring with our office.          Past medical, surgical, social and family history were reviewed.  ROS:   Please see the history of present illness.    All other systems reviewed and are negative.  EKGs/Labs/Other Studies Reviewed:    The following studies were reviewed today:  October 10, 2024 in-clinic device interrogation personally reviewed ***        Physical Exam:    VS:  There were no vitals taken for this visit.    Wt Readings from Last 3 Encounters:  03/19/24 171 lb 1.2 oz (77.6 kg)  07/24/23 171 lb 1.2 oz (77.6 kg)  06/07/23 171 lb (77.6 kg)     GEN: no distress CARD: RRR, No MRG.  Device pocket well-healed RESP: No IWOB. CTAB.      ASSESSMENT:    No diagnosis found. 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  At her last appointment in 2024, did not establish for remote monitoring.  #Chronic systolic heart failure Follows with [facility] for general cardiology.  Continue losartan , Lasix , Coreg .  I discussed my upcoming departure from [facility] during today's office appointment.  She will continue to follow-up with one of my partners, Dr. Kennyth.  Follow-up with EP APP in 1 year.  Signed, Valerie Holts, MD, [facility], [facility] 10/09/2024 3:47 PM    Electrophysiology [facility]

## 2024-10-10 ENCOUNTER — Ambulatory Visit: Attending: Cardiology | Admitting: Cardiology

## 2024-10-10 DIAGNOSIS — I5022 Chronic systolic (congestive) heart failure: Secondary | ICD-10-CM

## 2024-10-10 DIAGNOSIS — Z95810 Presence of automatic (implantable) cardiac defibrillator: Secondary | ICD-10-CM

## 2024-10-15 ENCOUNTER — Encounter: Payer: Self-pay | Admitting: Cardiology

## 2024-10-22 ENCOUNTER — Encounter: Payer: Self-pay | Admitting: Cardiology

## 2024-12-17 ENCOUNTER — Emergency Department
Admission: EM | Admit: 2024-12-17 | Discharge: 2024-12-17 | Disposition: A | Discharge status: Home / Self Care | Attending: Emergency Medicine | Admitting: Emergency Medicine

## 2024-12-17 ENCOUNTER — Other Ambulatory Visit: Payer: Self-pay

## 2024-12-17 DIAGNOSIS — G8929 Other chronic pain: Secondary | ICD-10-CM | POA: Insufficient documentation

## 2024-12-17 DIAGNOSIS — M545 Low back pain, unspecified: Secondary | ICD-10-CM | POA: Diagnosis present

## 2024-12-17 DIAGNOSIS — I10 Essential (primary) hypertension: Secondary | ICD-10-CM | POA: Diagnosis not present

## 2024-12-17 DIAGNOSIS — G629 Polyneuropathy, unspecified: Secondary | ICD-10-CM | POA: Insufficient documentation

## 2024-12-17 DIAGNOSIS — L84 Corns and callosities: Secondary | ICD-10-CM | POA: Insufficient documentation

## 2024-12-17 MED ORDER — KETOROLAC TROMETHAMINE 15 MG/ML IJ SOLN
15.0000 mg | Freq: Once | INTRAMUSCULAR | Status: AC
  Administered 2024-12-17: 15 mg via INTRAMUSCULAR
  Filled 2024-12-17: qty 1

## 2024-12-17 MED ORDER — GABAPENTIN 100 MG PO CAPS: 100.0000 mg | ORAL_CAPSULE | Freq: Two times a day (BID) | ORAL | 0 refills | Status: AC

## 2024-12-17 NOTE — ED Triage Notes (Signed)
 Pt sent from [facility] for chronic lower back pain. States she had a procedure for pain a couple months ago which offered relief and started again around Dec. Was given pain meds and it doesn't help. States she has a bed sore as a result from being immobile from pain. Pt A&Ox4.

## 2024-12-17 NOTE — ED Provider Notes (Signed)
 "  [facility] Provider Note    Event Date/Time   First MD Initiated Contact with Patient 12/17/24 1039     (approximate)   History   Back Pain   HPI  Valerie Munoz is a 74 y.o. female with a past medical history of chronic back pain, neuropathy, who presents today for evaluation of her bilateral pain from her neuropathy and her back pain.  Patient reports that all of her symptoms have been ongoing for the past 2 years and nothing is different today.  She reports that when she walks her toes curl and this caused a pain to the inner aspect of her foot, [place] office and she is wondering what to do about this.  She reports that she got a shot in her arm for her back pain several months ago and felt that this was helpful.  No urinary or fecal incontinence or retention.  Able to ambulate.  Patient Active Problem List   Diagnosis Date Noted   Chronic radicular lumbar pain 12/15/2022   Screening for colon cancer 11/16/2022   Adenomatous polyp of ascending colon 11/16/2022   Polyp of transverse colon 11/16/2022   Pre-op evaluation 11/10/2022   Hypertension 11/10/2022   Bilateral post-traumatic osteoarthritis of knee 07/05/2022   Chronic pain of both knees 06/09/2022   Neuropathic pain of both legs 06/09/2022   Chronic left shoulder pain 06/09/2022   Lumbar degenerative disc disease 06/09/2022   Lumbar facet arthropathy 06/09/2022   Arthritis of left glenohumeral joint 06/09/2022   Chronic pain syndrome 06/09/2022   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   AICD (automatic cardioverter/defibrillator) present 03/30/2022   Concussion with unknown loss of consciousness status 03/30/2022   Neuropathy 03/30/2022   Class 1 obesity due to excess calories without serious comorbidity with body mass index (BMI) of 32.0 to 32.9 in adult 03/30/2022   Thyroid  cyst 03/30/2022          Physical Exam   Triage Vital Signs: ED Triage Vitals  Encounter Vitals Group     BP  12/17/24 1025 (!) 146/88     Girls Systolic BP Percentile --      Girls Diastolic BP Percentile --      Boys Systolic BP Percentile --      Boys Diastolic BP Percentile --      Pulse Rate 12/17/24 1025 71     Resp 12/17/24 1025 17     Temp 12/17/24 1025 (!) 97.5 F (36.4 C)     Temp Source 12/17/24 1025 Oral     SpO2 12/17/24 1025 98 %     Weight --      Height --      Head Circumference --      Peak Flow --      Pain Score 12/17/24 1024 8     Pain Loc --      Pain Education --      Exclude from Growth Chart --     Most recent vital signs: Vitals:   12/17/24 1025 12/17/24 1142  BP: (!) 146/88 (!) 136/90  Pulse: 71 71  Resp: 17 16  Temp: (!) 97.5 F (36.4 C) 98.2 F (36.8 C)  SpO2: 98% 100%    Physical Exam Vitals and nursing note reviewed.  Constitutional:      General: Awake and alert. No acute distress.    Appearance: Normal appearance. The patient is normal weight.  HENT:     Head: Normocephalic and atraumatic.  Mouth: Mucous membranes are moist.  Eyes:     General: PERRL. Normal EOMs        Right eye: No discharge.        Left eye: No discharge.     Conjunctiva/sclera: Conjunctivae normal.  Cardiovascular:     Rate and Rhythm: Normal rate and regular rhythm.     Pulses: Normal pulses.  Pulmonary:     Effort: Pulmonary effort is normal. No respiratory distress.     Breath sounds: Normal breath sounds.  Abdominal:     Abdomen is soft. There is no abdominal tenderness. No rebound or guarding. No distention. Musculoskeletal:        General: No swelling. Normal range of motion.     Cervical back: Normal range of motion and neck supple.  Back: No midline tenderness. Strength and sensation 5/5 to bilateral lower extremities. Normal great toe extension against resistance. Normal sensation throughout feet. Normal patellar reflexes. Negative SLR and opposite SLR bilaterally. Negative FABER test Left foot with 0.5 x 0.5 cm corn to the inner portion of the foot.   No surrounding erythema.  No fluctuance. Skin:    General: Skin is warm and dry.     Capillary Refill: Capillary refill takes less than 2 seconds.     Findings: No rash.  Neurological:     Mental Status: The patient is awake and alert.      ED Results / Procedures / Treatments   Labs (all labs ordered are listed, but only abnormal results are displayed) Labs Reviewed - No data to display   EKG     RADIOLOGY     PROCEDURES:  Critical Care performed:   Procedures   MEDICATIONS ORDERED IN ED: Medications  ketorolac  (TORADOL ) 15 MG/ML injection 15 mg (15 mg Intramuscular Given 12/17/24 1145)     IMPRESSION / MDM / ASSESSMENT AND PLAN / ED COURSE  I reviewed the triage vital signs and the nursing notes.   Differential diagnosis includes, but is not limited to, chronic back pain, corn, lumbar radiculopathy, neuropathy.  Patient is awake and alert, hemodynamically stable and afebrile.  She has normal strength and sensation of bilateral lower extremities, no vertebral tenderness, able to ambulate with a steady gait, do not suspect cauda equina or cord compression. 5 out of 5 strength with intact sensation to extensor hallucis dorsiflexion and plantarflexion of bilateral lower extremities with normal patellar reflexes bilaterally. Most likely etiology at this point is muscle strain vs herniated disc. No red flags to indicate patient is at risk for more auspicious process that would require urgent/emergent spinal imaging or subspecialty evaluation at this time. No major trauma, no midline tenderness, no history or physical exam findings to suggest cauda equina syndrome or spinal cord compression. No focal neurological deficits on exam. No constitutional symptoms or history of immunosuppression or IVDA to suggest potential for epidural abscess. Not anticoagulated, no history of bleeding diastasis to suggest risk for epidural hematoma. No chronic steroid use or advanced age or history  of malignancy to suggest proclivity towards pathological fracture.  No abdominal pain or flank pain to suggest kidney stone, no history of kidney stone.  No fever or dysuria or CVAT to suggest pyelonephritis .  No chest pain, back pain, shortness of breath, neurological deficits, to suggest vascular catastrophe, and pulses are equal in all 4 extremities.    Symptoms feel the same today as they have in the past 2 years.  She reports that her neuropathy is what is  bothering her the most.  She was started on gabapentin  for this.  She has normal pulses bilaterally, not consistent with ischemic limb.  Feet are warm and well-perfused bilaterally.  No calf tenderness or swelling, not consistent with DVT.  She is ambulatory with a steady gait.  She was instructed to follow-up with podiatry for her corn and her concerns for her toes curling in her shoes.  We discussed return precautions in the meantime.  Patient or stands and agrees with plan.  Discharged in stable condition.  Patient's presentation is most consistent with exacerbation of chronic illness.    FINAL CLINICAL IMPRESSION(S) / ED DIAGNOSES   Final diagnoses:  Chronic bilateral low back pain without sciatica  Neuropathy  Corn     Rx / DC Orders   ED Discharge Orders          Ordered    gabapentin  (NEURONTIN ) 100 MG capsule  2 times daily        12/17/24 1125             Note:  This document was prepared using Dragon voice recognition software and may include unintentional dictation errors.   Kadyn Chovan E, PA-C 12/17/24 1710    Arlander Charleston, MD 12/18/24 [phone]  "

## 2024-12-17 NOTE — Discharge Instructions (Addendum)
 Please follow-up with podiatry.  You may take the gabapentin  to help with your neuropathy.  Please do not take this medication if you plan on driving, operating heavy machinery, or performing tasks that require concentration.  Please return for any new, worsening, or changing symptoms or other concerns.  It was a pleasure caring for you today.

## 2024-12-17 NOTE — ED Triage Notes (Signed)
 First nurse note: pt to ED from [facility] for lower back pain x2 months.

## 2025-01-06 NOTE — Progress Notes (Unsigned)
 "     Electrophysiology Clinic Note    Date:  01/06/2025  Patient ID:  Valerie, Munoz 1951-08-31, MRN [id number] PCP:  Harvey Gaetana CROME, NP  Cardiologist:  None  Electrophysiologist:  Valerie Kitty, MD    ***refresh  Discussed the use of AI scribe software for clinical note transcription with the patient, who gave verbal consent to proceed.   Patient Profile    Chief Complaint: ***  History of Present Illness: Valerie Munoz is a 74 y.o. female with PMH notable for HFrEF s/p ICD, HTN; seen today for Valerie Kitty, MD (Previously Dr. Cindie) for routine electrophysiology followup.   She last saw Dr. Cindie 12/2022 to establish care for ICD after moving to the area. Planned for TTE and enrollment in remote monitoring, neither were completed.   On follow-up today, ***   - needs TTE  Since last being seen in our clinic the patient reports doing ***.  she denies chest pain, palpitations, dyspnea, PND, orthopnea, nausea, vomiting, dizziness, syncope, edema, weight gain, or early satiety.      Arrhythmia/Device History Biotronik CRT-D - gen change 2020    ROS:  Please see the history of present illness. All other systems are reviewed and otherwise negative.    Physical Exam    VS:  There were no vitals taken for this visit. BMI: There is no height or weight on file to calculate BMI.           Wt Readings from Last 3 Encounters:  03/19/24 171 lb 1.2 oz (77.6 kg)  07/24/23 171 lb 1.2 oz (77.6 kg)  06/07/23 171 lb (77.6 kg)     ***  GEN- The patient is well appearing, alert and oriented x 3 today.   Lungs- Clear to ausculation bilaterally, normal work of breathing.  Heart- {Blank single:19197::Regular,Irregularly irregular} rate and rhythm, ***no murmurs, rubs or gallops Extremities- {EDEMA LEVEL:28147::No} peripheral edema, warm, dry Skin-  *** device pocket well-healed, no tethering    Device interrogation done today and reviewed by myself:   Battery *** Lead thresholds, impedence, sensing stable *** *** episodes *** changes made today   Studies Reviewed   Previous EP, cardiology notes.    EKG is ordered. Personal review of EKG from today shows:  ***        TTE, 05/28/2021 (via care everywhere) 1. The left ventricle is normal in size. The ejection fraction is 40 -  45%. There is no evidence of left ventricular hypertrophy. Wall motion is  abnormal with mild to moderate dysfunction. There is Doppler evidence of  diastolic dysfunction - Grade I.  2. The right ventricle and right atrium are normal in size.  3. The aortic root and left atrium are normal in size. The great vessels  and aortic arch are not well visualized.  4. The mitral valve is normal in appearance and function with trace mitral  regurgitation.  5. The aortic valve is trileaflet and normal in appearance and function  with no aortic insufficiency noted.  6. The tricuspid valve is normal with trace tricuspid regurgitation. The  right ventricular pressure is 32 mmHg.  7. The pulmonic valve is normal with no pulmonary insufficiency noted.  8. The pericardium is normal with no evidence of pericardial effusion  noted.  9. Inferior vena cava is not dilated.      Assessment and Plan     #) CRT-D in situ   #) HFmrEF   #) ***   {Are you ordering a  CV Procedure (e.g. stress test, cath, DCCV, TEE, etc)?   Press F2        :[id number]}   Current medicines are reviewed at length with the patient today.   The patient {ACTIONS; HAS/DOES NOT HAVE:19233} concerns regarding her medicines.  The following changes were made today:  {NONE DEFAULTED:18576}  Labs/ tests ordered today include: *** No orders of the defined types were placed in this encounter.    Disposition: Follow up with {EPMDS:28135::EP Team} or EP APP {EPFOLLOW UP:28173}   Signed, Valerie Sprowl, NP  01/06/25  1:55 PM  Electrophysiology [facility] "

## 2025-01-07 ENCOUNTER — Ambulatory Visit: Admitting: Cardiology

## 2025-01-30 ENCOUNTER — Ambulatory Visit: Admitting: Cardiology
